# Patient Record
Sex: Female | Born: 1950
Health system: Southern US, Community
[De-identification: ages and names within clinical notes are randomized; demographics above are authoritative.]

## PROBLEM LIST (undated history)

## (undated) DIAGNOSIS — I639 Cerebral infarction, unspecified: Secondary | ICD-10-CM

## (undated) HISTORY — DX: Cerebral infarction, unspecified: I63.9

## (undated) HISTORY — PX: OTHER SURGICAL HISTORY: SHX169

---

## 2018-08-27 ENCOUNTER — Other Ambulatory Visit: Payer: Self-pay

## 2018-08-27 ENCOUNTER — Emergency Department (HOSPITAL_BASED_OUTPATIENT_CLINIC_OR_DEPARTMENT_OTHER): Payer: Medicare Other

## 2018-08-27 ENCOUNTER — Observation Stay (HOSPITAL_BASED_OUTPATIENT_CLINIC_OR_DEPARTMENT_OTHER)
Admission: EM | Admit: 2018-08-27 | Discharge: 2018-08-28 | Disposition: A | Discharge status: Home / Self Care | Payer: Medicare Other | Attending: Internal Medicine | Admitting: Internal Medicine

## 2018-08-27 ENCOUNTER — Inpatient Hospital Stay (HOSPITAL_COMMUNITY): Payer: Medicare Other

## 2018-08-27 ENCOUNTER — Encounter (HOSPITAL_BASED_OUTPATIENT_CLINIC_OR_DEPARTMENT_OTHER): Payer: Self-pay | Admitting: Emergency Medicine

## 2018-08-27 DIAGNOSIS — R739 Hyperglycemia, unspecified: Secondary | ICD-10-CM

## 2018-08-27 DIAGNOSIS — I639 Cerebral infarction, unspecified: Principal | ICD-10-CM | POA: Insufficient documentation

## 2018-08-27 DIAGNOSIS — Z8673 Personal history of transient ischemic attack (TIA), and cerebral infarction without residual deficits: Secondary | ICD-10-CM | POA: Diagnosis present

## 2018-08-27 DIAGNOSIS — R4781 Slurred speech: Secondary | ICD-10-CM | POA: Diagnosis not present

## 2018-08-27 DIAGNOSIS — Z79899 Other long term (current) drug therapy: Secondary | ICD-10-CM | POA: Insufficient documentation

## 2018-08-27 DIAGNOSIS — G458 Other transient cerebral ischemic attacks and related syndromes: Secondary | ICD-10-CM | POA: Diagnosis not present

## 2018-08-27 DIAGNOSIS — R29818 Other symptoms and signs involving the nervous system: Secondary | ICD-10-CM | POA: Diagnosis not present

## 2018-08-27 DIAGNOSIS — R531 Weakness: Secondary | ICD-10-CM | POA: Diagnosis not present

## 2018-08-27 DIAGNOSIS — R911 Solitary pulmonary nodule: Secondary | ICD-10-CM | POA: Insufficient documentation

## 2018-08-27 DIAGNOSIS — R030 Elevated blood-pressure reading, without diagnosis of hypertension: Secondary | ICD-10-CM | POA: Insufficient documentation

## 2018-08-27 DIAGNOSIS — E785 Hyperlipidemia, unspecified: Secondary | ICD-10-CM | POA: Diagnosis not present

## 2018-08-27 LAB — RAPID URINE DRUG SCREEN, HOSP PERFORMED
Amphetamines: NOT DETECTED
Barbiturates: NOT DETECTED
Benzodiazepines: NOT DETECTED
COCAINE: NOT DETECTED
OPIATES: NOT DETECTED
TETRAHYDROCANNABINOL: NOT DETECTED

## 2018-08-27 LAB — COMPREHENSIVE METABOLIC PANEL
ALBUMIN: 3.9 g/dL (ref 3.5–5.0)
ALT: 23 U/L (ref 0–44)
ANION GAP: 11 (ref 5–15)
AST: 29 U/L (ref 15–41)
Alkaline Phosphatase: 86 U/L (ref 38–126)
BUN: 22 mg/dL (ref 8–23)
CALCIUM: 9.2 mg/dL (ref 8.9–10.3)
CO2: 26 mmol/L (ref 22–32)
Chloride: 102 mmol/L (ref 98–111)
Creatinine, Ser: 0.82 mg/dL (ref 0.44–1.00)
GFR calc Af Amer: >60 mL/min (ref >60)
GFR calc non Af Amer: >60 mL/min (ref >60)
GLUCOSE: 122 mg/dL — AB (ref 70–99)
POTASSIUM: 4.1 mmol/L (ref 3.5–5.1)
SODIUM: 139 mmol/L (ref 135–145)
TOTAL PROTEIN: 7.2 g/dL (ref 6.5–8.1)
Total Bilirubin: 0.5 mg/dL (ref 0.3–1.2)

## 2018-08-27 LAB — URINALYSIS, ROUTINE W REFLEX MICROSCOPIC
Bilirubin Urine: NEGATIVE
Glucose, UA: NEGATIVE mg/dL
Hgb urine dipstick: NEGATIVE
Ketones, ur: NEGATIVE mg/dL
LEUKOCYTES UA: NEGATIVE
Nitrite: NEGATIVE
PH: 7 (ref 5.0–8.0)
Protein, ur: NEGATIVE mg/dL

## 2018-08-27 LAB — CBC
HCT: 42.9 % (ref 36.0–46.0)
Hemoglobin: 14.6 g/dL (ref 12.0–15.0)
MCH: 30.5 pg (ref 26.0–34.0)
MCHC: 34 g/dL (ref 30.0–36.0)
MCV: 89.7 fL (ref 78.0–100.0)
PLATELETS: 342 10*3/uL (ref 150–400)
RBC: 4.78 MIL/uL (ref 3.87–5.11)
RDW: 12.5 % (ref 11.5–15.5)
WBC: 6.1 10*3/uL (ref 4.0–10.5)

## 2018-08-27 LAB — TSH: TSH: 1.495 u[IU]/mL (ref 0.350–4.500)

## 2018-08-27 LAB — PROTIME-INR
INR: 0.8
PROTHROMBIN TIME: 11 s — AB (ref 11.4–15.2)

## 2018-08-27 LAB — DIFFERENTIAL
Basophils Absolute: 0 10*3/uL (ref 0.0–0.1)
Basophils Relative: 1 %
EOS ABS: 0.2 10*3/uL (ref 0.0–0.7)
EOS PCT: 3 %
LYMPHS PCT: 37 %
Lymphs Abs: 2.3 10*3/uL (ref 0.7–4.0)
Monocytes Absolute: 0.7 10*3/uL (ref 0.1–1.0)
Monocytes Relative: 11 %
NEUTROS PCT: 48 %
Neutro Abs: 3 10*3/uL (ref 1.7–7.7)

## 2018-08-27 LAB — APTT: APTT: 33 s (ref 24–36)

## 2018-08-27 LAB — TROPONIN I

## 2018-08-27 LAB — CBG MONITORING, ED: Glucose-Capillary: 117 mg/dL — ABNORMAL HIGH (ref 70–99)

## 2018-08-27 LAB — ECHOCARDIOGRAM COMPLETE
Height: 65 in
Weight: 2240 oz

## 2018-08-27 LAB — ETHANOL: Alcohol, Ethyl (B): <10 mg/dL (ref <10)

## 2018-08-27 MED ORDER — CLOPIDOGREL BISULFATE 75 MG PO TABS
300.0000 mg | ORAL_TABLET | Freq: Once | ORAL | Status: AC
  Administered 2018-08-27: 300 mg via ORAL
  Filled 2018-08-27: qty 4

## 2018-08-27 MED ORDER — ASPIRIN 300 MG RE SUPP: 300.0000 mg | Freq: Every day | RECTAL | Status: DC

## 2018-08-27 MED ORDER — ATORVASTATIN CALCIUM 40 MG PO TABS
40.0000 mg | ORAL_TABLET | Freq: Every day | ORAL | Status: DC
  Administered 2018-08-27: 40 mg via ORAL
  Filled 2018-08-27: qty 1

## 2018-08-27 MED ORDER — IOPAMIDOL (ISOVUE-370) INJECTION 76%
100.0000 mL | Freq: Once | INTRAVENOUS | Status: AC | PRN
  Administered 2018-08-27: 100 mL via INTRAVENOUS

## 2018-08-27 MED ORDER — ENOXAPARIN SODIUM 40 MG/0.4ML ~~LOC~~ SOLN
40.0000 mg | SUBCUTANEOUS | Status: DC
  Administered 2018-08-27 – 2018-08-28 (×2): 40 mg via SUBCUTANEOUS
  Filled 2018-08-27 (×2): qty 0.4

## 2018-08-27 MED ORDER — ACETAMINOPHEN 650 MG RE SUPP: 650.0000 mg | RECTAL | Status: DC | PRN

## 2018-08-27 MED ORDER — ASPIRIN 325 MG PO TABS
325.0000 mg | ORAL_TABLET | Freq: Every day | ORAL | Status: DC
  Administered 2018-08-27 – 2018-08-28 (×2): 325 mg via ORAL
  Filled 2018-08-27 (×2): qty 1

## 2018-08-27 MED ORDER — ACETAMINOPHEN 160 MG/5ML PO SOLN: 650.0000 mg | ORAL | Status: DC | PRN

## 2018-08-27 MED ORDER — ACETAMINOPHEN 325 MG PO TABS: 650.0000 mg | ORAL_TABLET | ORAL | Status: DC | PRN

## 2018-08-27 MED ORDER — STROKE: EARLY STAGES OF RECOVERY BOOK
Freq: Once | Status: AC
  Administered 2018-08-27: 17:00:00

## 2018-08-27 MED ORDER — SENNOSIDES-DOCUSATE SODIUM 8.6-50 MG PO TABS: 1.0000 | ORAL_TABLET | Freq: Every evening | ORAL | Status: DC | PRN

## 2018-08-27 MED ORDER — CLOPIDOGREL BISULFATE 75 MG PO TABS
75.0000 mg | ORAL_TABLET | Freq: Every day | ORAL | Status: DC
  Administered 2018-08-28: 75 mg via ORAL
  Filled 2018-08-27 (×2): qty 1

## 2018-08-27 MED ORDER — SODIUM CHLORIDE 0.9 % IV SOLN
INTRAVENOUS | Status: DC
  Administered 2018-08-27: 17:00:00 via INTRAVENOUS

## 2018-08-27 NOTE — H&P (Signed)
History and Physical    Melinda Burns ZOX:[id number] DOB: September 23, 1951 DOA: 08/27/2018  PCP:  Oneta Rack (has never actually ever him) - no doctor in 23 years other than an oral surgeon, who "is a doctor, he checked a blood pressure" Consultants:  None Patient coming from:  Home - lives with husband; NOK: Husband, [phone]  Chief Complaint: Weakness  HPI: Melinda Burns is a 67 y.o. female with no significant past medical history presenting to [facility] with left-sided weakness and dysarthria.   She developed a cramp in the back of her left leg about 430 and was having trouble walking.  He subsided for a while and then she started feeling on her left side again.  Her husband noticed a left facial droop.  They went to [facility] for evaluation.  No dysphagia.  Mild dysarthria.  Her thought processes were fine - she was able to criticize her husband's driving on the way in.  Word selection was also fine.  The left leg > arm were involved and was moving slowly.  Her left face still looks droopy.  Her pulse was high and her BP was very elevated.  She does not know if her BP has ever been elevated before.   ED Course:  Negative CT and CTA (other than incidental RUL nodule).    Review of Systems: As per HPI; otherwise review of systems reviewed and negative.   Ambulatory Status:  Ambulates without assistance  History reviewed. No pertinent past medical history.  Past Surgical History:  Procedure Laterality Date  . cataracts      Social History   Socioeconomic History  . Marital status: Married    Spouse name: Not on file  . Number of children: Not on file  . Years of education: Not on file  . Highest education level: Not on file  Occupational History  . Occupation: Audiological scientist  Social Needs  . Financial resource strain: Not on file  . Food insecurity:    Worry: Not on file    Inability: Not on file  . Transportation needs:    Medical: Not on file    Non-medical: Not on file  Tobacco  Use  . Smoking status: Never Smoker  . Smokeless tobacco: Never Used  Substance and Sexual Activity  . Alcohol use: Yes    Comment: occasional wine  . Drug use: Not on file  . Sexual activity: Not on file  Lifestyle  . Physical activity:    Days per week: Not on file    Minutes per session: Not on file  . Stress: Not on file  Relationships  . Social connections:    Talks on phone: Not on file    Gets together: Not on file    Attends religious service: Not on file    Active member of club or organization: Not on file    Attends meetings of clubs or organizations: Not on file    Relationship status: Not on file  . Intimate partner violence:    Fear of current or ex partner: Not on file    Emotionally abused: Not on file    Physically abused: Not on file    Forced sexual activity: Not on file  Other Topics Concern  . Not on file  Social History Narrative  . Not on file    Allergies  Allergen Reactions  . Codeine     Family History  Problem Relation Age of Onset  . Transient ischemic attack Mother   .  COPD Mother     Prior to Admission medications   Not on File    Physical Exam: Vitals:   08/27/18 1007 08/27/18 1030 08/27/18 1109 08/27/18 1216  BP: (!) 147/73 139/67 (!) 150/95 (!) 145/85  Pulse: 69 71 74 72  Resp: 16 20 16 16   Temp: 98.4 F (36.9 C)   98.2 F (36.8 C)  TempSrc: Oral   Oral  SpO2: 100% 100% 99% 100%  Weight:      Height:         General:  Appears calm and comfortable and is NAD with left facial droop visible at rest Eyes:   EOMI, normal lids, iris ENT:  grossly normal hearing, lips & tongue, mmm Neck:  no LAD, masses or thyromegaly; no carotid bruits Cardiovascular:  RRR, no m/r/g. 1+ pitting LE edema.  Respiratory:   CTA bilaterally with no wheezes/rales/rhonchi.  Normal respiratory effort. Abdomen:  soft, NT, ND, NABS Skin:  no rash or induration seen on limited exam Musculoskeletal:  grossly normal tone BUE/BLE, good ROM, no bony  abnormality Psychiatric:  grossly normal mood and affect, speech fluent and appropriate, AOx3 Neurologic: CN 2-12 grossly intact other than left facial droop, moves all extremities in coordinated fashion, sensation intact    Radiological Exams on Admission: Ct Angio Head W Or Wo Contrast  Result Date: 08/27/2018 CLINICAL DATA:  Left-sided weakness.  Slurred speech. EXAM: CT ANGIOGRAPHY HEAD AND NECK TECHNIQUE: Multidetector CT imaging of the head and neck was performed using the standard protocol during bolus administration of intravenous contrast. Multiplanar CT image reconstructions and MIPs were obtained to evaluate the vascular anatomy. Carotid stenosis measurements (when applicable) are obtained utilizing NASCET criteria, using the distal internal carotid diameter as the denominator. CONTRAST:  ISOVUE-370 IOPAMIDOL (ISOVUE-370) INJECTION 76% COMPARISON:  CT head 08/27/2018 FINDINGS: CTA NECK FINDINGS Aortic arch: Standard branching. Imaged portion shows no evidence of aneurysm or dissection. No significant stenosis of the major arch vessel origins. Right carotid system: Right common carotid artery widely patent. Mild atherosclerotic plaque right carotid bifurcation without stenosis. Beaded irregularity of the right internal carotid artery at the C2 level compatible with fibromuscular dysplasia. Negative for dissection or stenosis. Left carotid system: Normal left carotid without stenosis or fibromuscular dysplasia. Vertebral arteries: Both vertebral arteries widely patent without stenosis Skeleton: Mild degenerative changes in the cervical spine. No acute skeletal abnormality. Other neck: Negative Upper chest: 4 mm nodule right upper lobe. Remaining upper lobes are clear. No effusion. Review of the MIP images confirms the above findings CTA HEAD FINDINGS Anterior circulation: Cavernous carotid normal bilaterally without stenosis. Anterior and middle cerebral arteries normal bilaterally without  stenosis or occlusion. Posterior circulation: Both vertebral arteries patent to the basilar. PICA patent bilaterally. AICA patent. Superior cerebellar and posterior cerebral arteries patent bilaterally. Basilar widely patent. Fetal origin of the right posterior cerebral artery. Venous sinuses: Patent Anatomic variants: None Delayed phase: Normal enhancement on delayed imaging. Mild chronic microvascular ischemic changes. Review of the MIP images confirms the above findings IMPRESSION: 1. No significant carotid or vertebral artery stenosis in the neck. Mild atherosclerotic disease right carotid bifurcation. Fibromuscular dysplasia right internal carotid artery without stenosis or dissection 2. Negative for emergent large vessel occlusion. No significant intracranial stenosis. 3. 4 mm right upper lobe nodule No follow-up needed if patient is low-risk. Non-contrast chest CT can be considered in 12 months if patient is high-risk. This recommendation follows the consensus statement: Guidelines for Management of Incidental Pulmonary Nodules Detected on CT  Images: From the Fleischner Society 2017; Radiology 2017; [phone]. Electronically Signed   By: Marlan Palau M.D.   On: 08/27/2018 08:26   Ct Angio Neck W And/or Wo Contrast  Result Date: 08/27/2018 CLINICAL DATA:  Left-sided weakness.  Slurred speech. EXAM: CT ANGIOGRAPHY HEAD AND NECK TECHNIQUE: Multidetector CT imaging of the head and neck was performed using the standard protocol during bolus administration of intravenous contrast. Multiplanar CT image reconstructions and MIPs were obtained to evaluate the vascular anatomy. Carotid stenosis measurements (when applicable) are obtained utilizing NASCET criteria, using the distal internal carotid diameter as the denominator. CONTRAST:  ISOVUE-370 IOPAMIDOL (ISOVUE-370) INJECTION 76% COMPARISON:  CT head 08/27/2018 FINDINGS: CTA NECK FINDINGS Aortic arch: Standard branching. Imaged portion shows no evidence  of aneurysm or dissection. No significant stenosis of the major arch vessel origins. Right carotid system: Right common carotid artery widely patent. Mild atherosclerotic plaque right carotid bifurcation without stenosis. Beaded irregularity of the right internal carotid artery at the C2 level compatible with fibromuscular dysplasia. Negative for dissection or stenosis. Left carotid system: Normal left carotid without stenosis or fibromuscular dysplasia. Vertebral arteries: Both vertebral arteries widely patent without stenosis Skeleton: Mild degenerative changes in the cervical spine. No acute skeletal abnormality. Other neck: Negative Upper chest: 4 mm nodule right upper lobe. Remaining upper lobes are clear. No effusion. Review of the MIP images confirms the above findings CTA HEAD FINDINGS Anterior circulation: Cavernous carotid normal bilaterally without stenosis. Anterior and middle cerebral arteries normal bilaterally without stenosis or occlusion. Posterior circulation: Both vertebral arteries patent to the basilar. PICA patent bilaterally. AICA patent. Superior cerebellar and posterior cerebral arteries patent bilaterally. Basilar widely patent. Fetal origin of the right posterior cerebral artery. Venous sinuses: Patent Anatomic variants: None Delayed phase: Normal enhancement on delayed imaging. Mild chronic microvascular ischemic changes. Review of the MIP images confirms the above findings IMPRESSION: 1. No significant carotid or vertebral artery stenosis in the neck. Mild atherosclerotic disease right carotid bifurcation. Fibromuscular dysplasia right internal carotid artery without stenosis or dissection 2. Negative for emergent large vessel occlusion. No significant intracranial stenosis. 3. 4 mm right upper lobe nodule No follow-up needed if patient is low-risk. Non-contrast chest CT can be considered in 12 months if patient is high-risk. This recommendation follows the consensus statement:  Guidelines for Management of Incidental Pulmonary Nodules Detected on CT Images: From the Fleischner Society 2017; Radiology 2017; [id number]. Electronically Signed   By: Marlan Palau M.D.   On: 08/27/2018 08:26   Ct Head Code Stroke Wo Contrast  Result Date: 08/27/2018 CLINICAL DATA:  Code stroke. Slurred speech. Episode of left-sided weakness. EXAM: CT HEAD WITHOUT CONTRAST TECHNIQUE: Contiguous axial images were obtained from the base of the skull through the vertex without intravenous contrast. COMPARISON:  None. FINDINGS: Brain: No evidence of acute infarction, hemorrhage, hydrocephalus, extra-axial collection or mass lesion/mass effect. Mild chronic microvascular ischemia in the frontal white matter bilaterally. Small chronic infarct anterior limb internal capsule on the left. Vascular: Negative for hyperdense vessel Skull: Negative Sinuses/Orbits: Paranasal sinuses clear.  Bilateral cataract surgery Other: None ASPECTS (Alberta Stroke Program Early CT Score) - Ganglionic level infarction (caudate, lentiform nuclei, internal capsule, insula, M1-M3 cortex): 7 - Supraganglionic infarction (M4-M6 cortex): 3 Total score (0-10 with 10 being normal): 10 IMPRESSION: 1. No acute abnormality. 2. Mild chronic ischemic change in the white matter and internal capsule on the left. 3. ASPECTS is 10 4. These results were called by telephone at the time of interpretation on  08/27/2018 at 7:00 am to Dr. Madilyn Hook , who verbally acknowledged these results. Electronically Signed   By: Marlan Palau M.D.   On: 08/27/2018 07:00    EKG: Independently reviewed.  Sinus tachycardia with rate 100; nonspecific ST changes with no evidence of acute ischemia   Labs on Admission: I have personally reviewed the available labs and imaging studies at the time of the admission.  Pertinent labs:   Glucose 122, CMP otherwise WNL Troponin <0.03 CBC WNL INR 0.8 UA essentially normal ETOH negative  Assessment/Plan Active  Problems:   Acute CVA (cerebrovascular accident) (HCC)   Elevated BP without diagnosis of hypertension   Hyperglycemia   TIA vs. Acute CTA -Patient presented this AM to [facility] with acute CVA symptoms -Her symptoms have primarily resolved, although she continues to have a left facial droop -CT/CTA negative, but suspicion is still fairly high for CVA; if negative MRI, then TIA -Will admit for further CVA/TIA evaluation -Telemetry monitoring -MRI/MRA -Echo -Risk stratification with FLP, A1c; will also check TSH and UDS -ASA daily -Neurology consult -PT/OT/ST/Nutrition Consults -Will start empiric Lipitor 40 mg daily for now  Elevated BP -Allow permissive HTN for now -Treat BP only if >220/120, and then with goal of 15% reduction -She has not seen a physician in >20 years and so it is not unlikely that she has undiagnosed HTN   Hyperglycemia -Check A1c and follow fasting glucose on AM labs    DVT prophylaxis:  Lovenox  Code Status: DNR - confirmed with patient/family Family Communication: Husband present throughout evaluation Disposition Plan:  Home once clinically improved Consults called: Neurology; PT/OT/ST/Nutrition  Admission status: Admit - It is my clinical opinion that admission to INPATIENT is reasonable and necessary because of the expectation that this patient will require hospital care that crosses at least 2 midnights to treat this condition based on the medical complexity of the problems presented.  Given the aforementioned information, the predictability of an adverse outcome is felt to be significant.    Jonah Blue MD [facility]  If note is complete, please contact covering daytime or nighttime physician. [email or web address] Password [email or web address]  08/27/2018, 1:25 PM

## 2018-08-27 NOTE — ED Notes (Signed)
Patient transported to CT 

## 2018-08-27 NOTE — ED Notes (Signed)
ED Provider at bedside. 

## 2018-08-27 NOTE — ED Triage Notes (Signed)
Patient states she woke up at 0430 am with left leg cramping and left leg and left arm weakness; states had difficulty walking; per spouse states slurred speech

## 2018-08-27 NOTE — Consult Note (Addendum)
Neurology Consultation  Reason for Consult: TIA/CVA Referring Physician: OGBATA,   History is obtained from: Husband and patient  HPI: Melinda Burns is a 67 y.o. female with no significant pertinent history as patient has not seen a PCP in the last 17 years.  Patient awoke at approximately 430 this morning and noticed that she had decreased sensation in her left face, arm, leg she also felt weak in these areas.  She talked her husband who noted that she was having dysarthria and slight left facial droop.  Patient did not stay up all night one back to sleep but upon waking noted when she tried to get out of bed she had difficulty walking secondary to left leg weakness.  Has been continued to see the facial droop and some dysarthria which has resolved at this time.  For that reason elevated blood pressure looking at the chart her blood pressure was approximately 177/97.  Patient has been admitted at this time she feels as though she is resolving.  Especially with the leg strength.  She still sounds dysarthric however her husband seems to think this is her baseline.  She does not take aspirin or any other medications as noted she has not seen a PCP in quite a while.   LKW: 2:30(patient states 3, but unsure of time) on 08/27/2018 tpa given?: no, out of window with resolving symptoms Premorbid modified Rankin scale (mRS): 0 High stroke score is 1  ROS: A 14 point ROS was performed and is negative except as noted in the HPI.   History reviewed. No pertinent past medical history. None that she knows of as she has not seen a PCP in a long time   Family History  Problem Relation Age of Onset  . Transient ischemic attack Mother   . COPD Mother    Social History:   reports that she has never smoked. She has never used smokeless tobacco. She reports that she drinks alcohol. Her drug history is not on file.  Medications  Current Facility-Administered Medications:  .   stroke: mapping our early  stages of recovery book, , Does not apply, Once, Jonah Blue, MD .  0.9 %  sodium chloride infusion, , Intravenous, Continuous, Jonah Blue, MD .  acetaminophen (TYLENOL) tablet 650 mg, 650 mg, Oral, Q4H PRN **OR** acetaminophen (TYLENOL) solution 650 mg, 650 mg, Per Tube, Q4H PRN **OR** acetaminophen (TYLENOL) suppository 650 mg, 650 mg, Rectal, Q4H PRN, Jonah Blue, MD .  aspirin suppository 300 mg, 300 mg, Rectal, Daily **OR** aspirin tablet 325 mg, 325 mg, Oral, Daily, Jonah Blue, MD .  atorvastatin (LIPITOR) tablet 40 mg, 40 mg, Oral, q1800, Jonah Blue, MD .  enoxaparin (LOVENOX) injection 40 mg, 40 mg, Subcutaneous, Q24H, Jonah Blue, MD .  senna-docusate (Senokot-S) tablet 1 tablet, 1 tablet, Oral, QHS PRN, Jonah Blue, MD   Exam: Current vital signs: BP (!) 145/85 (BP Location: Left Arm)   Pulse 72   Temp 98.2 F (36.8 C) (Oral)   Resp 16   Ht 5\' 5"  (1.651 m)   Wt 63.5 kg   SpO2 100%   BMI 23.30 kg/m  Vital signs in last 24 hours: Temp:  [98.1 F (36.7 C)-98.4 F (36.9 C)] 98.2 F (36.8 C) (09/05 1216) Pulse Rate:  [69-84] 72 (09/05 1216) Resp:  [16-22] 16 (09/05 1216) BP: (136-177)/(67-101) 145/85 (09/05 1216) SpO2:  [99 %-100 %] 100 % (09/05 1216) Weight:  [63.5 kg] 63.5 kg (09/05 0631)  GENERAL: Awake, alert in  NAD HEENT: - Normocephalic and atraumatic, dry mm, no LN++, no Thyromegally Ext: warm, well perfused, intact peripheral pulses, __ edema  NEURO:  Mental Status: AA&Ox3, speech is dysarthric to me however husband states that it sounds normal to him.  Naming, repetition, fluency, and comprehension intact. Cranial Nerves: PERRL 2 mm/brisk. EOMI, visual fields full, left corner of her mouth has a facial droop along with decreased nasolabial fold, increased facial sensation on the left, hearing intact, tongue/uvula/soft palate midline, Motor: 5/5 throughout with the exception of her left tricep extension which is 4/5 Tone: is normal  and bulk is normal Sensation- Intact to light touch bilaterally Coordination: FTN intact bilaterally, no ataxia in BLE. Gait- deferred    Labs I have reviewed labs in epic and the results pertinent to this consultation are:   CBC    Component Value Date/Time   WBC 6.1 08/27/2018 0636   RBC 4.78 08/27/2018 0636   HGB 14.6 08/27/2018 0636   HCT 42.9 08/27/2018 0636   PLT 342 08/27/2018 0636   MCV 89.7 08/27/2018 0636   MCH 30.5 08/27/2018 0636   MCHC 34.0 08/27/2018 0636   RDW 12.5 08/27/2018 0636   LYMPHSABS 2.3 08/27/2018 0636   MONOABS 0.7 08/27/2018 0636   EOSABS 0.2 08/27/2018 0636   BASOSABS 0.0 08/27/2018 0636    CMP     Component Value Date/Time   NA 139 08/27/2018 0636   K 4.1 08/27/2018 0636   CL 102 08/27/2018 0636   CO2 26 08/27/2018 0636   GLUCOSE 122 (H) 08/27/2018 0636   BUN 22 08/27/2018 0636   CREATININE 0.82 08/27/2018 0636   CALCIUM 9.2 08/27/2018 0636   PROT 7.2 08/27/2018 0636   ALBUMIN 3.9 08/27/2018 0636   AST 29 08/27/2018 0636   ALT 23 08/27/2018 0636   ALKPHOS 86 08/27/2018 0636   BILITOT 0.5 08/27/2018 0636   GFRNONAA >60 08/27/2018 0636   GFRAA >60 08/27/2018 0636    Lipid Panel  No results found for: CHOL, TRIG, HDL, CHOLHDL, VLDL, LDLCALC, LDLDIRECT   Imaging I have reviewed the images obtained:*  CT-scan of the brain--no acute abnormalities.  CTA of head neck- no significant carotid or vertebral artery stenosis in the neck, mild atherosclerotic disease in the right carotid bifurcation.  Fibromuscular dysplasia right internal carotid artery without stenosis or dissection.  Negative for emergent large vessel occlusion.  MRI examination of the brain--pending  I have seen and evaluated the patient. I have reviewed the above note and made appropriate changes.   Assessment: 67 yo F with symptoms consistent with a mild stroke. She was not a tpa candidate due to improving symptoms and being unclear if she was truly in the window  due to uncertainty of time. She is being admitted for risk factor modification.   Recommendations: Recommend # MRI of the brain without contrast #Transthoracic Echo,   # Start patient on ASA 325mg  daily, plavix x 3 weeks #Start or continue Atorvastatin 80 mg/other high intensity statin # BP goal: permissive HTN upto 220/120 mmHg # HBAIC and Lipid profile # Telemetry monitoring # Frequent neuro checks # NPO until passes stroke swallow screen # please page stroke NP  Or  PA  Or MD from 8am -4 pm  as this patient from this time will be  followed by the stroke.   You can look them up on [email or web address]  Password [email or web address]  Ritta Slot, MD [facility] [phone]  If 7pm- 7am, please page neurology on call as listed  in AMION.

## 2018-08-27 NOTE — ED Provider Notes (Signed)
[facility] EMERGENCY DEPARTMENT Provider Note   CSN: [id number] Arrival date & time: 08/27/18  7829     History   Chief Complaint Chief Complaint  Patient presents with  . Weakness    HPI Melinda Burns is a 67 y.o. female.  The history is provided by the patient and the spouse. No language interpreter was used.  Weakness    Melinda Burns is a 67 y.o. female who presents to the Emergency Department complaining of weakness. She awoke this morning at 430 and noticed she had a cramp in her left calf and weakness in the left arm and left leg.She woke to use the bathroom around three and was able to get up without difficulty. She went to bed around 930 and was asymptomatic at that time. She has noted difficulty with walking up and down the stairs as well as with her exercise routine. She is having difficulty holding her coffee and feels weak in her left hand. Her husband notes that her speech is slightly different from baseline. She has no known medical problems and takes no medications. No prior similar symptoms.  No recent illness.   History reviewed. No pertinent past medical history.  There are no active problems to display for this patient.   History reviewed. No pertinent surgical history.   OB History   None      Home Medications    Prior to Admission medications   Not on File    Family History No family history on file.  Social History Social History   Tobacco Use  . Smoking status: Never Smoker  . Smokeless tobacco: Never Used  Substance Use Topics  . Alcohol use: Yes  . Drug use: Not on file     Allergies   Codeine   Review of Systems Review of Systems  Neurological: Positive for weakness.  All other systems reviewed and are negative.    Physical Exam Updated Vital Signs BP (!) 177/97   Pulse 84   Temp 98.3 F (36.8 C) (Oral)   Resp (!) 22   Ht 5\' 5"  (1.651 m)   Wt 63.5 kg   SpO2 100%   BMI 23.30 kg/m   Physical  Exam  Constitutional: She is oriented to person, place, and time. She appears well-developed and well-nourished.  HENT:  Head: Normocephalic and atraumatic.  Cardiovascular: Normal rate and regular rhythm.  No murmur heard. Pulmonary/Chest: Effort normal and breath sounds normal. No respiratory distress.  Abdominal: Soft. There is no tenderness. There is no rebound and no guarding.  Musculoskeletal: She exhibits no tenderness.  Trace nonpitting edema to BLE  Neurological: She is alert and oriented to person, place, and time.  Weakness of left lower face with mild dysarthria. No aphasia.  Visual fields are grossly intact. No ataxia on finger to nose bilaterally. Five out of five strength in all four extremities with sensation to light touch intact in all four extremities. No pronator drift.  Skin: Skin is warm and dry.  Psychiatric: She has a normal mood and affect. Her behavior is normal.  Nursing note and vitals reviewed.    ED Treatments / Results  Labs (all labs ordered are listed, but only abnormal results are displayed) Labs Reviewed  PROTIME-INR - Abnormal; Notable for the following components:      Result Value   Prothrombin Time 11.0 (*)    All other components within normal limits  COMPREHENSIVE METABOLIC PANEL - Abnormal; Notable for the following components:  Glucose, Bld 122 (*)    All other components within normal limits  CBG MONITORING, ED - Abnormal; Notable for the following components:   Glucose-Capillary 117 (*)    All other components within normal limits  ETHANOL  APTT  CBC  DIFFERENTIAL  TROPONIN I  RAPID URINE DRUG SCREEN, HOSP PERFORMED  URINALYSIS, ROUTINE W REFLEX MICROSCOPIC    EKG EKG Interpretation  Date/Time:  Thursday August 27 2018 06:30:32 EDT Ventricular Rate:  100 PR Interval:    QRS Duration: 97 QT Interval:  368 QTC Calculation: 475 R Axis:   89 Text Interpretation:  Sinus tachycardia Borderline right axis deviation  Borderline repolarization abnormality Baseline wander in lead(s) V2 Confirmed by Nicanor Alcon, April ([id number]) on 08/27/2018 6:33:45 AM   Radiology Ct Head Code Stroke Wo Contrast  Result Date: 08/27/2018 CLINICAL DATA:  Code stroke. Slurred speech. Episode of left-sided weakness. EXAM: CT HEAD WITHOUT CONTRAST TECHNIQUE: Contiguous axial images were obtained from the base of the skull through the vertex without intravenous contrast. COMPARISON:  None. FINDINGS: Brain: No evidence of acute infarction, hemorrhage, hydrocephalus, extra-axial collection or mass lesion/mass effect. Mild chronic microvascular ischemia in the frontal white matter bilaterally. Small chronic infarct anterior limb internal capsule on the left. Vascular: Negative for hyperdense vessel Skull: Negative Sinuses/Orbits: Paranasal sinuses clear.  Bilateral cataract surgery Other: None ASPECTS (Alberta Stroke Program Early CT Score) - Ganglionic level infarction (caudate, lentiform nuclei, internal capsule, insula, M1-M3 cortex): 7 - Supraganglionic infarction (M4-M6 cortex): 3 Total score (0-10 with 10 being normal): 10 IMPRESSION: 1. No acute abnormality. 2. Mild chronic ischemic change in the white matter and internal capsule on the left. 3. ASPECTS is 10 4. These results were called by telephone at the time of interpretation on 08/27/2018 at 7:00 am to Dr. Madilyn Hook , who verbally acknowledged these results. Electronically Signed   By: Marlan Palau M.D.   On: 08/27/2018 07:00    Procedures Procedures (including critical care time)  Medications Ordered in ED Medications - No data to display   Initial Impression / Assessment and Plan / ED Course  I have reviewed the triage vital signs and the nursing notes.  Pertinent labs & imaging results that were available during my care of the patient were reviewed by me and considered in my medical decision making (see chart for details).     Should here for evaluation of left sided weakness, speech  difficulties. She does have somewhat lower facial droop as well as dysarthria on examination. Her left upper and left lower extremity weakness have resolved on ED arrival. During her ED stay her symptoms continue to improve. Concern for TIA versus CVA. Medicine consulted for admission for further workup. Discussed the case with Dr. Amada Jupiter with neurology. Patient and family updated of findings of studies and recommendation for admission and they are in agreement with treatment plan.  Final Clinical Impressions(s) / ED Diagnoses   Final diagnoses:  None    ED Discharge Orders    None       Tilden Fossa, MD 08/27/18 [phone]

## 2018-08-27 NOTE — ED Provider Notes (Signed)
MSE was initiated and I personally evaluated the patient and placed orders (if any) at  6:49 AM on August 27, 2018.  The patient appears stable so that the remainder of the MSE may be completed by another provider.  Last seen normal at 930 pm 08/26/18.  Awoke with cramps in the LUE and LLE.  Then got up to work out and noticed BLE weak and some slurred speech, now resolved.  Also queasy on treadmill.     PE:  NCAT EOMI MMM RRR CTAB  NABS, soft non tender FROM x 4 5/5 strength  CT pending, labs pending     Melinda Suares, MD 08/27/18 [phone]

## 2018-08-27 NOTE — Progress Notes (Signed)
  Echocardiogram 2D Echocardiogram has been performed.  Leta Jungling M 08/27/2018, 3:18 PM

## 2018-08-27 NOTE — Consult Note (Signed)
Neurology Consultation  Reason for Consult: left side weakness   History is obtained from: Patient and Husband   HPI: Melinda Burns is a 67 y.o. female with no significant past medical history that presented to the ED on today (08/27/18) with c/o left side weakness. Patient reports that she woke up around 0430 this morning with notable left leg cramping, upon laying back down in bed she began to feel that the symptoms had resolved, shortly afterward she began to start her day as usual with her work; however, once she began to perform her usual in-home exercises, she noticed that her left leg was becoming weaker as she attempted to step up on her aerobics box. The left-side weakness was also accompanied by left-side facial drooping and slurred speech that was observed by her husband. The patient was last normal around 0330 this morning. Thus she was brought into the ED and Neurology was consulted for suspected stroke. Patient is now resting comfortably in bed, she denies any headache, blurred vision, weakness, dysphasia or pain.    LKW: 0330 today   ROS: A 14 point ROS was performed and is negative except as noted in the HPI.   History reviewed. No pertinent past medical history.   Family History  Problem Relation Age of Onset  . Transient ischemic attack Mother   . COPD Mother      Social History:   reports that she has never smoked. She has never used smokeless tobacco. She reports that she drinks alcohol. Her drug history is not on file.  Medications  Current Facility-Administered Medications:  .   stroke: mapping our early stages of recovery book, , Does not apply, Once, Jonah Blue, MD .  0.9 %  sodium chloride infusion, , Intravenous, Continuous, Jonah Blue, MD .  acetaminophen (TYLENOL) tablet 650 mg, 650 mg, Oral, Q4H PRN **OR** acetaminophen (TYLENOL) solution 650 mg, 650 mg, Per Tube, Q4H PRN **OR** acetaminophen (TYLENOL) suppository 650 mg, 650 mg, Rectal, Q4H  PRN, Jonah Blue, MD .  aspirin suppository 300 mg, 300 mg, Rectal, Daily **OR** aspirin tablet 325 mg, 325 mg, Oral, Daily, Jonah Blue, MD .  atorvastatin (LIPITOR) tablet 40 mg, 40 mg, Oral, q1800, Jonah Blue, MD .  enoxaparin (LOVENOX) injection 40 mg, 40 mg, Subcutaneous, Q24H, Jonah Blue, MD .  senna-docusate (Senokot-S) tablet 1 tablet, 1 tablet, Oral, QHS PRN, Jonah Blue, MD   Exam: Current vital signs: BP (!) 145/85 (BP Location: Left Arm)   Pulse 72   Temp 98.2 F (36.8 C) (Oral)   Resp 16   Ht 5\' 5"  (1.651 m)   Wt 63.5 kg   SpO2 100%   BMI 23.30 kg/m  Vital signs in last 24 hours: Temp:  [98.1 F (36.7 C)-98.4 F (36.9 C)] 98.2 F (36.8 C) (09/05 1216) Pulse Rate:  [69-84] 72 (09/05 1216) Resp:  [16-22] 16 (09/05 1216) BP: (136-177)/(67-101) 145/85 (09/05 1216) SpO2:  [99 %-100 %] 100 % (09/05 1216) Weight:  [63.5 kg] 63.5 kg (09/05 0631)  GENERAL: Awake, alert in NAD HEENT: - Normocephalic and atraumatic, dry mm, no Thyromegally Ext: warm, well perfused, no edema  NEURO:  Mental Status: AA&Ox3, speech is mildly dyarthric.  Naming, repetition, fluency, and comprehension intact. Cranial Nerves: PERRL, 71mm/brisk. EOMI, visual fields full, left side facial droop, facial sensation intact, hearing intact, tongue/uvula/soft palate midline, normal 5/5 sternocleidomastoid and trapezius muscle strength.  Motor: 5/5 strength in all extremities  Tone: is normal and bulk is normal Sensation- Intact  to light touch bilaterally Coordination: FTN intact bilaterally, no ataxia in BLE. Gait- steady   NIHSS: 2 LOC: 0 LOC Questions: 0 LOC Commands: 0 Best Gaze: 0 Visual: 0 Facial Palsy: 1  Motor Arm: 0 Motor Leg:0 Limb Ataxia: 0 Sensory: 0 Best Language: 0 Dysarthria: 1 Extinction and Inattention: 0     Labs I have reviewed labs in epic and the results pertinent to this consultation are:   CBC    Component Value Date/Time   WBC 6.1  08/27/2018 0636   RBC 4.78 08/27/2018 0636   HGB 14.6 08/27/2018 0636   HCT 42.9 08/27/2018 0636   PLT 342 08/27/2018 0636   MCV 89.7 08/27/2018 0636   MCH 30.5 08/27/2018 0636   MCHC 34.0 08/27/2018 0636   RDW 12.5 08/27/2018 0636   LYMPHSABS 2.3 08/27/2018 0636   MONOABS 0.7 08/27/2018 0636   EOSABS 0.2 08/27/2018 0636   BASOSABS 0.0 08/27/2018 0636    CMP     Component Value Date/Time   NA 139 08/27/2018 0636   K 4.1 08/27/2018 0636   CL 102 08/27/2018 0636   CO2 26 08/27/2018 0636   GLUCOSE 122 (H) 08/27/2018 0636   BUN 22 08/27/2018 0636   CREATININE 0.82 08/27/2018 0636   CALCIUM 9.2 08/27/2018 0636   PROT 7.2 08/27/2018 0636   ALBUMIN 3.9 08/27/2018 0636   AST 29 08/27/2018 0636   ALT 23 08/27/2018 0636   ALKPHOS 86 08/27/2018 0636   BILITOT 0.5 08/27/2018 0636   GFRNONAA >60 08/27/2018 0636   GFRAA >60 08/27/2018 0636    Lipid Panel  No results found for: CHOL, TRIG, HDL, CHOLHDL, VLDL, LDLCALC, LDLDIRECT   Imaging I have reviewed the images obtained:  CT ANGIO NECK W/WO CONTRAST: Impression: 1. No significant carotid or vertebral artery stenosis in the neck. Mild atherosclerotic disease right carotid bifurcation. Fibromuscular dysplasia right internal carotid artery without stenosis or dissection.  2. Negative for emergency large vessel occlusion. No significant intracranial stenosis. 3. 9mm right upper lobe nodule.  No follow-up needed if the patient is low-risk. Non-contrast chest CT can be considered in 12 months if patient is hight-risk.     Assessment: 67 y/o caucasian female with no significant past medial history presenting to the ED with left-side weakness and left-side facial drooping. Patient reports that the weakness has resolved; however, she continues to have left-side facial drooping accompanied by mild dysarthria. At this time, differential diagnosis would be suggestive of TIA versus Stroke with pending MR, MRA.     Recommendations: -MR,  MRA Head wo contrast -Echocardiogram -Carotid Doppler  -Aspirin 81mg  p.o. daily  -Plavix 75mg  p.o. daily  -PT consult and evaluation

## 2018-08-28 ENCOUNTER — Inpatient Hospital Stay (HOSPITAL_COMMUNITY): Payer: Medicare Other

## 2018-08-28 DIAGNOSIS — I639 Cerebral infarction, unspecified: Secondary | ICD-10-CM

## 2018-08-28 DIAGNOSIS — R739 Hyperglycemia, unspecified: Secondary | ICD-10-CM | POA: Diagnosis not present

## 2018-08-28 DIAGNOSIS — R030 Elevated blood-pressure reading, without diagnosis of hypertension: Secondary | ICD-10-CM | POA: Diagnosis not present

## 2018-08-28 DIAGNOSIS — Z8673 Personal history of transient ischemic attack (TIA), and cerebral infarction without residual deficits: Secondary | ICD-10-CM | POA: Diagnosis present

## 2018-08-28 LAB — HIV ANTIBODY (ROUTINE TESTING W REFLEX): HIV Screen 4th Generation wRfx: NONREACTIVE

## 2018-08-28 LAB — LIPID PANEL
CHOL/HDL RATIO: 4.7 ratio
Cholesterol: 256 mg/dL — ABNORMAL HIGH (ref 0–200)
HDL: 54 mg/dL (ref >40)
LDL Cholesterol: 166 mg/dL — ABNORMAL HIGH (ref 0–99)
Triglycerides: 179 mg/dL — ABNORMAL HIGH (ref <150)
VLDL: 36 mg/dL (ref 0–40)

## 2018-08-28 LAB — HEMOGLOBIN A1C
Hgb A1c MFr Bld: 5.3 % (ref 4.8–5.6)
Mean Plasma Glucose: 105.41 mg/dL

## 2018-08-28 MED ORDER — CLOPIDOGREL BISULFATE 75 MG PO TABS: 75.0000 mg | ORAL_TABLET | Freq: Every day | ORAL | Status: DC

## 2018-08-28 MED ORDER — ASPIRIN 81 MG PO TBEC: 81.0000 mg | DELAYED_RELEASE_TABLET | Freq: Every day | ORAL | 1 refills | Status: AC

## 2018-08-28 MED ORDER — ASPIRIN EC 81 MG PO TBEC: 81.0000 mg | DELAYED_RELEASE_TABLET | Freq: Every day | ORAL | Status: DC

## 2018-08-28 MED ORDER — ATORVASTATIN CALCIUM 80 MG PO TABS: 80.0000 mg | ORAL_TABLET | Freq: Every day | ORAL | Status: DC

## 2018-08-28 MED ORDER — CLOPIDOGREL BISULFATE 75 MG PO TABS: 75.0000 mg | ORAL_TABLET | Freq: Every day | ORAL | 0 refills | Status: AC

## 2018-08-28 MED ORDER — ATORVASTATIN CALCIUM 80 MG PO TABS: 80.0000 mg | ORAL_TABLET | Freq: Every day | ORAL | 1 refills | Status: DC

## 2018-08-28 NOTE — Progress Notes (Signed)
SLP Cancellation Note  Patient Details Name: Melinda Burns MRN: [id number] DOB: 05/16/1951   Cancelled treatment:       Reason Eval/Treat Not Completed: SLP screened, no needs identified, will sign off. Pt reports her speech is at baseline. 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 No needs identified at this time as pt declines assessment. Encouraged her to follow up with MD for further assessment in OP setting should she note functional deficits.  Rondel Baton, [place], CCC-SLP Speech-Language Pathologist Acute Rehabilitation Services Pager: [phone] Office: [phone]  Arlana Lindau 08/28/2018, 3:01 PM

## 2018-08-28 NOTE — Progress Notes (Addendum)
STROKE TEAM PROGRESS NOTE  HPI: ( Dr Amada Jupiter ) Melinda Burns is a 67 y.o. female with no significant pertinent history as patient has not seen a PCP in the last 17 years.  Patient awoke at approximately 430 this morning and noticed that she had decreased sensation in her left face, arm, leg she also felt weak in these areas.  She talked her husband who noted that she was having dysarthria and slight left facial droop.  Patient did not stay up all night one back to sleep but upon waking noted when she tried to get out of bed she had difficulty walking secondary to left leg weakness.  Has been continued to see the facial droop and some dysarthria which has resolved at this time.  For that reason elevated blood pressure looking at the chart her blood pressure was approximately 177/97.  Patient has been admitted at this time she feels as though she is resolving.  Especially with the leg strength.  She still sounds dysarthric however her husband seems to think this is her baseline.  She does not take aspirin or any other medications as noted she has not seen a PCP in quite a while.   LKW: 2:30(patient states 3, but unsure of time) on 08/27/2018 tpa given?: no, out of window with resolving symptoms Premorbid modified Rankin scale (mRS): 0 NIH stroke score is 1  INTERVAL HISTORY Her husband is at the bedside.  She recounted onset after waking yesterday while doing step aerobics. MRI shows a new stroke.   Vitals:   08/27/18 2016 08/28/18 0014 08/28/18 0434 08/28/18 0753  BP: (!) 144/84 140/70 127/76 (!) 151/86  Pulse: 76 63 68 70  Resp: 18 18 18 16   Temp: 98.3 F (36.8 C) 98.1 F (36.7 C) 98.4 F (36.9 C) 98.4 F (36.9 C)  TempSrc: Oral Oral Oral Oral  SpO2: 99% 98% 99% 100%  Weight:      Height:        CBC:  Recent Labs  Lab 08/27/18 0636  WBC 6.1  NEUTROABS 3.0  HGB 14.6  HCT 42.9  MCV 89.7  PLT 342    Basic Metabolic Panel:  Recent Labs  Lab 08/27/18 0636  NA 139  K  4.1  CL 102  CO2 26  GLUCOSE 122*  BUN 22  CREATININE 0.82  CALCIUM 9.2   Lipid Panel:     Component Value Date/Time   CHOL 256 (H) 08/28/2018 0609   TRIG 179 (H) 08/28/2018 0609   HDL 54 08/28/2018 0609   CHOLHDL 4.7 08/28/2018 0609   VLDL 36 08/28/2018 0609   LDLCALC 166 (H) 08/28/2018 0609   HgbA1c:  Lab Results  Component Value Date   HGBA1C 5.3 08/28/2018   Urine Drug Screen:     Component Value Date/Time   LABOPIA NONE DETECTED 08/27/2018 0730   COCAINSCRNUR NONE DETECTED 08/27/2018 0730   LABBENZ NONE DETECTED 08/27/2018 0730   AMPHETMU NONE DETECTED 08/27/2018 0730   THCU NONE DETECTED 08/27/2018 0730   LABBARB NONE DETECTED 08/27/2018 0730    Alcohol Level     Component Value Date/Time   ETH <10 08/27/2018 0636    IMAGING Ct Angio Head W Or Wo Contrast  Result Date: 08/27/2018 CLINICAL DATA:  Left-sided weakness.  Slurred speech. EXAM: CT ANGIOGRAPHY HEAD AND NECK TECHNIQUE: Multidetector CT imaging of the head and neck was performed using the standard protocol during bolus administration of intravenous contrast. Multiplanar CT image reconstructions and MIPs were obtained  to evaluate the vascular anatomy. Carotid stenosis measurements (when applicable) are obtained utilizing NASCET criteria, using the distal internal carotid diameter as the denominator. CONTRAST:  ISOVUE-370 IOPAMIDOL (ISOVUE-370) INJECTION 76% COMPARISON:  CT head 08/27/2018 FINDINGS: CTA NECK FINDINGS Aortic arch: Standard branching. Imaged portion shows no evidence of aneurysm or dissection. No significant stenosis of the major arch vessel origins. Right carotid system: Right common carotid artery widely patent. Mild atherosclerotic plaque right carotid bifurcation without stenosis. Beaded irregularity of the right internal carotid artery at the C2 level compatible with fibromuscular dysplasia. Negative for dissection or stenosis. Left carotid system: Normal left carotid without stenosis or  fibromuscular dysplasia. Vertebral arteries: Both vertebral arteries widely patent without stenosis Skeleton: Mild degenerative changes in the cervical spine. No acute skeletal abnormality. Other neck: Negative Upper chest: 4 mm nodule right upper lobe. Remaining upper lobes are clear. No effusion. Review of the MIP images confirms the above findings CTA HEAD FINDINGS Anterior circulation: Cavernous carotid normal bilaterally without stenosis. Anterior and middle cerebral arteries normal bilaterally without stenosis or occlusion. Posterior circulation: Both vertebral arteries patent to the basilar. PICA patent bilaterally. AICA patent. Superior cerebellar and posterior cerebral arteries patent bilaterally. Basilar widely patent. Fetal origin of the right posterior cerebral artery. Venous sinuses: Patent Anatomic variants: None Delayed phase: Normal enhancement on delayed imaging. Mild chronic microvascular ischemic changes. Review of the MIP images confirms the above findings IMPRESSION: 1. No significant carotid or vertebral artery stenosis in the neck. Mild atherosclerotic disease right carotid bifurcation. Fibromuscular dysplasia right internal carotid artery without stenosis or dissection 2. Negative for emergent large vessel occlusion. No significant intracranial stenosis. 3. 4 mm right upper lobe nodule No follow-up needed if patient is low-risk. Non-contrast chest CT can be considered in 12 months if patient is high-risk. This recommendation follows the consensus statement: Guidelines for Management of Incidental Pulmonary Nodules Detected on CT Images: From the Fleischner Society 2017; Radiology 2017; 284:228-243. Electronically Signed   By: Marlan Palau M.D.   On: 08/27/2018 08:26   Ct Angio Neck W And/or Wo Contrast  Result Date: 08/27/2018 CLINICAL DATA:  Left-sided weakness.  Slurred speech. EXAM: CT ANGIOGRAPHY HEAD AND NECK TECHNIQUE: Multidetector CT imaging of the head and neck was performed  using the standard protocol during bolus administration of intravenous contrast. Multiplanar CT image reconstructions and MIPs were obtained to evaluate the vascular anatomy. Carotid stenosis measurements (when applicable) are obtained utilizing NASCET criteria, using the distal internal carotid diameter as the denominator. CONTRAST:  ISOVUE-370 IOPAMIDOL (ISOVUE-370) INJECTION 76% COMPARISON:  CT head 08/27/2018 FINDINGS: CTA NECK FINDINGS Aortic arch: Standard branching. Imaged portion shows no evidence of aneurysm or dissection. No significant stenosis of the major arch vessel origins. Right carotid system: Right common carotid artery widely patent. Mild atherosclerotic plaque right carotid bifurcation without stenosis. Beaded irregularity of the right internal carotid artery at the C2 level compatible with fibromuscular dysplasia. Negative for dissection or stenosis. Left carotid system: Normal left carotid without stenosis or fibromuscular dysplasia. Vertebral arteries: Both vertebral arteries widely patent without stenosis Skeleton: Mild degenerative changes in the cervical spine. No acute skeletal abnormality. Other neck: Negative Upper chest: 4 mm nodule right upper lobe. Remaining upper lobes are clear. No effusion. Review of the MIP images confirms the above findings CTA HEAD FINDINGS Anterior circulation: Cavernous carotid normal bilaterally without stenosis. Anterior and middle cerebral arteries normal bilaterally without stenosis or occlusion. Posterior circulation: Both vertebral arteries patent to the basilar. PICA patent bilaterally. AICA  patent. Superior cerebellar and posterior cerebral arteries patent bilaterally. Basilar widely patent. Fetal origin of the right posterior cerebral artery. Venous sinuses: Patent Anatomic variants: None Delayed phase: Normal enhancement on delayed imaging. Mild chronic microvascular ischemic changes. Review of the MIP images confirms the above findings  IMPRESSION: 1. No significant carotid or vertebral artery stenosis in the neck. Mild atherosclerotic disease right carotid bifurcation. Fibromuscular dysplasia right internal carotid artery without stenosis or dissection 2. Negative for emergent large vessel occlusion. No significant intracranial stenosis. 3. 4 mm right upper lobe nodule No follow-up needed if patient is low-risk. Non-contrast chest CT can be considered in 12 months if patient is high-risk. This recommendation follows the consensus statement: Guidelines for Management of Incidental Pulmonary Nodules Detected on CT Images: From the Fleischner Society 2017; Radiology 2017; 284:228-243. Electronically Signed   By: Marlan Palau M.D.   On: 08/27/2018 08:26   Ct Head Code Stroke Wo Contrast  Result Date: 08/27/2018 CLINICAL DATA:  Code stroke. Slurred speech. Episode of left-sided weakness. EXAM: CT HEAD WITHOUT CONTRAST TECHNIQUE: Contiguous axial images were obtained from the base of the skull through the vertex without intravenous contrast. COMPARISON:  None. FINDINGS: Brain: No evidence of acute infarction, hemorrhage, hydrocephalus, extra-axial collection or mass lesion/mass effect. Mild chronic microvascular ischemia in the frontal white matter bilaterally. Small chronic infarct anterior limb internal capsule on the left. Vascular: Negative for hyperdense vessel Skull: Negative Sinuses/Orbits: Paranasal sinuses clear.  Bilateral cataract surgery Other: None ASPECTS ([place] Stroke Program Early CT Score) - Ganglionic level infarction (caudate, lentiform nuclei, internal capsule, insula, M1-M3 cortex): 7 - Supraganglionic infarction (M4-M6 cortex): 3 Total score (0-10 with 10 being normal): 10 IMPRESSION: 1. No acute abnormality. 2. Mild chronic ischemic change in the white matter and internal capsule on the left. 3. ASPECTS is 10 4. These results were called by telephone at the time of interpretation on 08/27/2018 at 7:00 am to Dr. Madilyn Hook , who  verbally acknowledged these results. Electronically Signed   By: Marlan Palau M.D.   On: 08/27/2018 07:00    PHYSICAL EXAM Pleasant middle-aged Caucasian lady currently not in distress. . Afebrile. Head is nontraumatic. Neck is supple without bruit.    Cardiac exam no murmur or gallop. Lungs are clear to auscultation. Distal pulses are well felt. Neurological Exam ;  Awake  Alert oriented x 3. Normal speech and language.eye movements full without nystagmus.fundi were not visualized. Vision acuity and fields appear normal. Hearing is normal. Palatal movements are normal. Face symmetric. Tongue midline. Normal strength, tone, reflexes and coordination. Normal sensation. Gait deferred.   ASSESSMENT/PLAN Melinda Burns is a 67 y.o. female with no significant past medical history  presenting with decreased sensation of the L and L sided weakness.   Stroke:  Small right basal ganglia infarct secondary to small vessel disease source  Code Stroke CT head No acute stroke. Mild chronic ischemic dz white matter and L internal capsule. ASPECTS 10.     CTA head & neck R ICA FMD. Mild R ICA atherosclerosis. 4 mm RUL nodule.  MRI  Small R basal ganglia infarct  2D Echo  EF 60-65%. No source of embolus   LDL 166  HgbA1c 5.3  Lovenox 40 mg sq daily for VTE prophylaxis  No antithrombotic prior to admission, now on aspirin 325 mg daily and clopidogrel 75 mg daily following a 300 mg plavix load Given mild stroke, recommend aspirin 81 mg and plavix 75 mg daily x 3 weeks, then  aspirin alone. Orders adjusted.    Therapy recommendations:  No therapy needs    Disposition:  pending  Follow-up Stroke Clinic at [facility] in 4 weeks. Office will call with appointment date and time. Order placed. Get a PCP for ongoing risk factor control   Elevated BP  No hx Hypertension  Not on home medications  Stable 120-150s now . Permissive hypertension (OK if < 220/120) but gradually  normalize in 5-7 days . Long-term BP goal normotensive . At followup, will check BP and determine if antihypertensives needed  Hyperlipidemia  Home meds:  No statin  Now on lipitor 40  LDL 166, goal < 70  Increase statin to 80 mg daily  Consider repatha if statin is not effective. Dr. Pearlean Brownie discussed with pt/husband  Continue statin at discharge  Other Stroke Risk Factors  Advanced age  ETOH use, advised to drink no more than 1 drink(s) a day  Family hx TIA  Hospital day # 1  Annie Main, MSN, APRN, ANVP-BC, AGPCNP-BC Advanced Practice Stroke Nurse [facility] See Amion for Schedule & Pager information 08/28/2018 11:44 AM  I have personally examined this patient, reviewed notes, independently viewed imaging studies, participated in medical decision making and plan of care.ROS completed by me personally and pertinent positives fully documented  I have made any additions or clarifications directly to the above note. Agree with note above.  She presented with left-sided weakness due to right brain subcortical infarct etiology likely small vessel disease. Continue dual antiplatelet therapy for 3 weeks followed by aspirin alone. Continue ongoing stroke workup. On discussion the patient and husband regarding the stroke and discussion about stroke prevention and treatment and answered questions. Discussed with Dr. Clearnce Sorrel. Greater than 50% time during this 25 minute visit was spent on counseling and coordination of care about lacunar infarct and answered questions  Delia Heady, MD Medical Director [facility] Pager: [phone] 08/28/2018 3:29 PM  To contact Stroke Continuity provider, please refer to [email or web address]. After hours, contact General Neurology

## 2018-08-28 NOTE — Plan of Care (Signed)
Adequate for discharge.

## 2018-08-28 NOTE — Discharge Summary (Signed)
Physician Discharge Summary  Melinda Burns GNF:[id number] DOB: 15-Apr-1951 DOA: 08/27/2018  PCP: Patient, No Pcp Per  Admit date: 08/27/2018 Discharge date: 08/28/2018  Admitted From: Home Disposition: Home  Recommendations for Outpatient Follow-up:  1. Follow up with PCP in 1-2 weeks 2. Please obtain BMP/CBC in one week 3. Please repeat your lipid panel in approximately 1 to 3 months while on your cholesterol medication 4. Please repeat a CT scan of your chest in 12 months to follow-up on a right upper lobe lung nodule 5. Please continue taking the aspirin and Plavix for a total of 3 weeks and then stop the Plavix and continue the aspirin 81 mg indefinitely   Home Health: No Equipment/Devices: None  Discharge Condition: Stable CODE STATUS: Full Diet recommendation: Heart Healthy   Brief/Interim Summary:  #) Acute CVA: Patient was admitted with left-sided weakness and facial droop.  CTA of the head and neck showed no significant carotid or vertebral stenosis in the neck.  Some incidental fibromuscular dysplasia of the right internal carotid artery was noted without stenosis or dissection.  Echo was unremarkable.  MRI of brain showed Acute/subacute nonhemorrhagic infarcts involving the posterior aspect of the right caudate lobe and lentiform nucleus.  Patient was discharged on aspirin and Plavix for 3 weeks and then plan to discontinue the Plavix and only remain on aspirin 81 mg.  Patient was also started on high-dose statin.  #) Right upper lobe pulmonary nodule: This was detected incidentally on CT angios.  It was 4 mm in size.  Patient will need repeat noncontrast chest CT in 12 months to follow-up  Discharge Diagnoses:  Active Problems:   Acute CVA (cerebrovascular accident) (HCC)   Elevated BP without diagnosis of hypertension   Hyperglycemia    Discharge Instructions  Discharge Instructions    Ambulatory referral to Neurology   Complete by:  As directed    Follow up  with stroke clinic NP (Jessica Vanschaick or Darrol Angel, if both not available, consider Dr. Delia Heady, Dr. Jamelle Rushing, or Dr. Naomie Dean) at [facility] in about 4 weeks.   Call MD for:  difficulty breathing, headache or visual disturbances   Complete by:  As directed    Call MD for:  extreme fatigue   Complete by:  As directed    Call MD for:  hives   Complete by:  As directed    Call MD for:  persistant dizziness or light-headedness   Complete by:  As directed    Call MD for:  persistant nausea and vomiting   Complete by:  As directed    Call MD for:  redness, tenderness, or signs of infection (pain, swelling, redness, odor or green/yellow discharge around incision site)   Complete by:  As directed    Call MD for:  severe uncontrolled pain   Complete by:  As directed    Call MD for:  temperature >100.4   Complete by:  As directed    Diet - low sodium heart healthy   Complete by:  As directed    Discharge instructions   Complete by:  As directed    Please follow-up with your PCP in 1 to 2 weeks.  Please follow-up with neurology clinic in approximately 6 weeks.  Please take the aspirin and Plavix for 3 weeks and then stop the Plavix and continue taking aspirin indefinitely.   Increase activity slowly   Complete by:  As directed      Allergies as of  08/28/2018      Reactions   Codeine Other (See Comments)   Nausea , Dizziness      Medication List    TAKE these medications   aspirin 81 MG EC tablet Take 1 tablet (81 mg total) by mouth daily. Start taking on:  08/29/2018   atorvastatin 80 MG tablet Commonly known as:  LIPITOR Take 1 tablet (80 mg total) by mouth daily at 6 PM.   clopidogrel 75 MG tablet Commonly known as:  PLAVIX Take 1 tablet (75 mg total) by mouth daily for 21 days. Start taking on:  08/29/2018      Follow-up Information    [facility] Follow up in 4 week(s).   Specialty:  Neurology Why:  stroke  clinic. office will call with appt date and time. Contact information: [address] [place] [address] [phone]         Allergies  Allergen Reactions  . Codeine Other (See Comments)    Nausea , Dizziness    Consultations: Neurology  Procedures/Studies: Ct Angio Head W Or Wo Contrast  Result Date: 08/27/2018 CLINICAL DATA:  Left-sided weakness.  Slurred speech. EXAM: CT ANGIOGRAPHY HEAD AND NECK TECHNIQUE: Multidetector CT imaging of the head and neck was performed using the standard protocol during bolus administration of intravenous contrast. Multiplanar CT image reconstructions and MIPs were obtained to evaluate the vascular anatomy. Carotid stenosis measurements (when applicable) are obtained utilizing NASCET criteria, using the distal internal carotid diameter as the denominator. CONTRAST:  ISOVUE-370 IOPAMIDOL (ISOVUE-370) INJECTION 76% COMPARISON:  CT head 08/27/2018 FINDINGS: CTA NECK FINDINGS Aortic arch: Standard branching. Imaged portion shows no evidence of aneurysm or dissection. No significant stenosis of the major arch vessel origins. Right carotid system: Right common carotid artery widely patent. Mild atherosclerotic plaque right carotid bifurcation without stenosis. Beaded irregularity of the right internal carotid artery at the C2 level compatible with fibromuscular dysplasia. Negative for dissection or stenosis. Left carotid system: Normal left carotid without stenosis or fibromuscular dysplasia. Vertebral arteries: Both vertebral arteries widely patent without stenosis Skeleton: Mild degenerative changes in the cervical spine. No acute skeletal abnormality. Other neck: Negative Upper chest: 4 mm nodule right upper lobe. Remaining upper lobes are clear. No effusion. Review of the MIP images confirms the above findings CTA HEAD FINDINGS Anterior circulation: Cavernous carotid normal bilaterally without stenosis. Anterior and middle cerebral  arteries normal bilaterally without stenosis or occlusion. Posterior circulation: Both vertebral arteries patent to the basilar. PICA patent bilaterally. AICA patent. Superior cerebellar and posterior cerebral arteries patent bilaterally. Basilar widely patent. Fetal origin of the right posterior cerebral artery. Venous sinuses: Patent Anatomic variants: None Delayed phase: Normal enhancement on delayed imaging. Mild chronic microvascular ischemic changes. Review of the MIP images confirms the above findings IMPRESSION: 1. No significant carotid or vertebral artery stenosis in the neck. Mild atherosclerotic disease right carotid bifurcation. Fibromuscular dysplasia right internal carotid artery without stenosis or dissection 2. Negative for emergent large vessel occlusion. No significant intracranial stenosis. 3. 4 mm right upper lobe nodule No follow-up needed if patient is low-risk. Non-contrast chest CT can be considered in 12 months if patient is high-risk. This recommendation follows the consensus statement: Guidelines for Management of Incidental Pulmonary Nodules Detected on CT Images: From the Fleischner Society 2017; Radiology 2017; 284:228-243. Electronically Signed   By: Marlan Palau M.D.   On: 08/27/2018 08:26   Ct Angio Neck W And/or Wo Contrast  Result Date: 08/27/2018 CLINICAL DATA:  Left-sided weakness.  Slurred speech. EXAM: CT ANGIOGRAPHY HEAD AND NECK TECHNIQUE: Multidetector CT imaging of the head and neck was performed using the standard protocol during bolus administration of intravenous contrast. Multiplanar CT image reconstructions and MIPs were obtained to evaluate the vascular anatomy. Carotid stenosis measurements (when applicable) are obtained utilizing NASCET criteria, using the distal internal carotid diameter as the denominator. CONTRAST:  ISOVUE-370 IOPAMIDOL (ISOVUE-370) INJECTION 76% COMPARISON:  CT head 08/27/2018 FINDINGS: CTA NECK FINDINGS Aortic arch: Standard  branching. Imaged portion shows no evidence of aneurysm or dissection. No significant stenosis of the major arch vessel origins. Right carotid system: Right common carotid artery widely patent. Mild atherosclerotic plaque right carotid bifurcation without stenosis. Beaded irregularity of the right internal carotid artery at the C2 level compatible with fibromuscular dysplasia. Negative for dissection or stenosis. Left carotid system: Normal left carotid without stenosis or fibromuscular dysplasia. Vertebral arteries: Both vertebral arteries widely patent without stenosis Skeleton: Mild degenerative changes in the cervical spine. No acute skeletal abnormality. Other neck: Negative Upper chest: 4 mm nodule right upper lobe. Remaining upper lobes are clear. No effusion. Review of the MIP images confirms the above findings CTA HEAD FINDINGS Anterior circulation: Cavernous carotid normal bilaterally without stenosis. Anterior and middle cerebral arteries normal bilaterally without stenosis or occlusion. Posterior circulation: Both vertebral arteries patent to the basilar. PICA patent bilaterally. AICA patent. Superior cerebellar and posterior cerebral arteries patent bilaterally. Basilar widely patent. Fetal origin of the right posterior cerebral artery. Venous sinuses: Patent Anatomic variants: None Delayed phase: Normal enhancement on delayed imaging. Mild chronic microvascular ischemic changes. Review of the MIP images confirms the above findings IMPRESSION: 1. No significant carotid or vertebral artery stenosis in the neck. Mild atherosclerotic disease right carotid bifurcation. Fibromuscular dysplasia right internal carotid artery without stenosis or dissection 2. Negative for emergent large vessel occlusion. No significant intracranial stenosis. 3. 4 mm right upper lobe nodule No follow-up needed if patient is low-risk. Non-contrast chest CT can be considered in 12 months if patient is high-risk. This  recommendation follows the consensus statement: Guidelines for Management of Incidental Pulmonary Nodules Detected on CT Images: From the Fleischner Society 2017; Radiology 2017; 284:228-243. Electronically Signed   By: Marlan Palau M.D.   On: 08/27/2018 08:26   Mr Brain Wo Contrast  Result Date: 08/28/2018 CLINICAL DATA:  Acute onset of left-sided weakness and left-sided facial droop. Slurred speech. EXAM: MRI HEAD WITHOUT CONTRAST TECHNIQUE: Multiplanar, multiecho pulse sequences of the brain and surrounding structures were obtained without intravenous contrast. COMPARISON:  CT head and CTA head and neck from the same day. FINDINGS: Brain: The diffusion-weighted images demonstrate an acute/subacute nonhemorrhagic infarct involving the posterior aspect of the right caudate nucleus and posterior lentiform nucleus. T2 signal changes are associated with both infarcts. Mild periventricular white matter changes are present in addition. No other acute or focal cortical abnormality is present. Basal ganglia are otherwise intact. The brainstem and cerebellum is normal. Focal T2 hyperintensity is present within the right lateral aspect of the pituitary gland measuring 2 x 2.5 mm on coronal imaging. There is no mass effect. Vascular: Flow is present in the major intracranial arteries. Skull and upper cervical spine: The skull base is within normal limits. Craniocervical junction is normal. Upper cervical spine is unremarkable. Marrow signal is normal. Sinuses/Orbits: The paranasal sinuses and mastoid air cells are clear. Bilateral lens replacements are present. The globes and orbits are otherwise within normal limits. IMPRESSION: 1. Acute/subacute nonhemorrhagic infarcts involving the posterior  aspect of the right caudate lobe and lentiform nucleus. This corresponds with the patient's acute symptoms. 2. Mild periventricular white matter changes are present otherwise. 3. No other acute intracranial abnormality. These  results will be called to the ordering clinician or representative by the Radiologist Assistant, and communication documented in the PACS or zVision Dashboard. Electronically Signed   By: Marin Roberts M.D.   On: 08/28/2018 12:39   Ct Head Code Stroke Wo Contrast  Result Date: 08/27/2018 CLINICAL DATA:  Code stroke. Slurred speech. Episode of left-sided weakness. EXAM: CT HEAD WITHOUT CONTRAST TECHNIQUE: Contiguous axial images were obtained from the base of the skull through the vertex without intravenous contrast. COMPARISON:  None. FINDINGS: Brain: No evidence of acute infarction, hemorrhage, hydrocephalus, extra-axial collection or mass lesion/mass effect. Mild chronic microvascular ischemia in the frontal white matter bilaterally. Small chronic infarct anterior limb internal capsule on the left. Vascular: Negative for hyperdense vessel Skull: Negative Sinuses/Orbits: Paranasal sinuses clear.  Bilateral cataract surgery Other: None ASPECTS (Alberta Stroke Program Early CT Score) - Ganglionic level infarction (caudate, lentiform nuclei, internal capsule, insula, M1-M3 cortex): 7 - Supraganglionic infarction (M4-M6 cortex): 3 Total score (0-10 with 10 being normal): 10 IMPRESSION: 1. No acute abnormality. 2. Mild chronic ischemic change in the white matter and internal capsule on the left. 3. ASPECTS is 10 4. These results were called by telephone at the time of interpretation on 08/27/2018 at 7:00 am to Dr. Madilyn Hook , who verbally acknowledged these results. Electronically Signed   By: Marlan Palau M.D.   On: 08/27/2018 07:00   Echo 08/27/2018:Study Conclusions  - Left ventricle: The cavity size was normal. There was mild   concentric hypertrophy. Systolic function was normal. The   estimated ejection fraction was in the range of 60% to 65%. Wall   motion was normal; there were no regional wall motion   abnormalities. - Aortic valve: Mildly calcified annulus. Trileaflet; normal   thickness  leaflets. There was mild regurgitation. - Aortic root: The aortic root was mildly dilated.  Impressions:  - No cardiac source of emboli was indentified.  Subjective:   Discharge Exam: Vitals:   08/28/18 0753 08/28/18 1154  BP: (!) 151/86 (!) 152/84  Pulse: 70 75  Resp: 16   Temp: 98.4 F (36.9 C) 98.4 F (36.9 C)  SpO2: 100% 96%   Vitals:   08/28/18 0014 08/28/18 0434 08/28/18 0753 08/28/18 1154  BP: 140/70 127/76 (!) 151/86 (!) 152/84  Pulse: 63 68 70 75  Resp: 18 18 16    Temp: 98.1 F (36.7 C) 98.4 F (36.9 C) 98.4 F (36.9 C) 98.4 F (36.9 C)  TempSrc: Oral Oral Oral Oral  SpO2: 98% 99% 100% 96%  Weight:      Height:        General: Pt is alert, awake, not in acute distress Cardiovascular: RRR, S1/S2 +, no rubs, no gallops Respiratory: CTA bilaterally, no wheezing, no rhonchi Abdominal: Soft, NT, ND, bowel sounds + Extremities: no edema, Neuro: Cranial nerves II through XII intact, bilateral upper extremity lower extremity symptoms 5 out of 5    The results of significant diagnostics from this hospitalization (including imaging, microbiology, ancillary and laboratory) are listed below for reference.     Microbiology: No results found for this or any previous visit (from the past 240 hour(s)).   Labs: BNP (last 3 results) No results for input(s): BNP in the last 8760 hours. Basic Metabolic Panel: Recent Labs  Lab 08/27/18 0636  NA 139  K 4.1  CL 102  CO2 26  GLUCOSE 122*  BUN 22  CREATININE 0.82  CALCIUM 9.2   Liver Function Tests: Recent Labs  Lab 08/27/18 0636  AST 29  ALT 23  ALKPHOS 86  BILITOT 0.5  PROT 7.2  ALBUMIN 3.9   No results for input(s): LIPASE, AMYLASE in the last 168 hours. No results for input(s): AMMONIA in the last 168 hours. CBC: Recent Labs  Lab 08/27/18 0636  WBC 6.1  NEUTROABS 3.0  HGB 14.6  HCT 42.9  MCV 89.7  PLT 342   Cardiac Enzymes: Recent Labs  Lab 08/27/18 0636  TROPONINI <0.03    BNP: Invalid input(s): POCBNP CBG: Recent Labs  Lab 08/27/18 0635  GLUCAP 117*   D-Dimer No results for input(s): DDIMER in the last 72 hours. Hgb A1c Recent Labs    08/28/18 0609  HGBA1C 5.3   Lipid Profile Recent Labs    08/28/18 0609  CHOL 256*  HDL 54  LDLCALC 166*  TRIG 179*  CHOLHDL 4.7   Thyroid function studies Recent Labs    08/27/18 1353  TSH 1.495   Anemia work up No results for input(s): VITAMINB12, FOLATE, FERRITIN, TIBC, IRON, RETICCTPCT in the last 72 hours. Urinalysis    Component Value Date/Time   COLORURINE YELLOW 08/27/2018 0730   APPEARANCEUR CLEAR 08/27/2018 0730   LABSPEC <1.005 (L) 08/27/2018 0730   PHURINE 7.0 08/27/2018 0730   GLUCOSEU NEGATIVE 08/27/2018 0730   HGBUR NEGATIVE 08/27/2018 0730   BILIRUBINUR NEGATIVE 08/27/2018 0730   KETONESUR NEGATIVE 08/27/2018 0730   PROTEINUR NEGATIVE 08/27/2018 0730   NITRITE NEGATIVE 08/27/2018 0730   LEUKOCYTESUR NEGATIVE 08/27/2018 0730   Sepsis Labs Invalid input(s): PROCALCITONIN,  WBC,  LACTICIDVEN Microbiology No results found for this or any previous visit (from the past 240 hour(s)).   Time coordinating discharge: 25  SIGNED:   Delaine Lame, MD  [facility] 08/28/2018, 1:43 PM  If 7PM-7AM, please contact night-coverage [email or web address] Password [email or web address]

## 2018-08-28 NOTE — Evaluation (Signed)
Physical Therapy Evaluation Patient Details Name: Melinda Burns MRN: [id number] DOB: 1951-05-12 Today's Date: 08/28/2018   History of Present Illness  Patient is a 67 y/o female who presents with left sided weakness, facial droop, and slurred speech. Head CT-unremarkable. Brain MRI-pending. No PMH.  Clinical Impression  Patient tolerated gait training, stair training and higher level balance activities without any deviations in gait. Pt independent PTA, works as an Airline pilot and active. Scored 23/24 on DGI. Pt reports all symptoms have resolved and she is functioning at baseline. Pt does not require skilled therapy services at this time. Education re: BeFAST. All education completed. Discharge from therapy.    Follow Up Recommendations No PT follow up    Equipment Recommendations  None recommended by PT    Recommendations for Other Services       Precautions / Restrictions Precautions Precautions: None Restrictions Weight Bearing Restrictions: No      Mobility  Bed Mobility               General bed mobility comments: Sitting EOB upon P Tarrival.   Transfers Overall transfer level: Independent                  Ambulation/Gait Ambulation/Gait assistance: Independent Gait Distance (Feet): 350 Feet Assistive device: None Gait Pattern/deviations: Step-through pattern   Gait velocity interpretation: >4.37 ft/sec, indicative of normal walking speed General Gait Details: steady gait, tolerated higher level balance challenges without difficulty.   Stairs Stairs: Yes Stairs assistance: Modified independent (Device/Increase time)   Number of Stairs: 4 General stair comments: Slow to descend.  Wheelchair Mobility    Modified Rankin (Stroke Patients Only) Modified Rankin (Stroke Patients Only) Pre-Morbid Rankin Score: No symptoms Modified Rankin: No significant disability     Balance Overall balance assessment: Needs assistance Sitting-balance  support: Feet supported;No upper extremity supported Sitting balance-Leahy Scale: Normal     Standing balance support: During functional activity Standing balance-Leahy Scale: Good               High level balance activites: Backward walking;Direction changes;Turns;Sudden stops;Head turns High Level Balance Comments: tolerated above with no deviations in gait.  Standardized Balance Assessment Standardized Balance Assessment : Dynamic Gait Index   Dynamic Gait Index Level Surface: Normal Change in Gait Speed: Normal Gait with Horizontal Head Turns: Normal Gait with Vertical Head Turns: Normal Gait and Pivot Turn: Normal Step Over Obstacle: Normal Step Around Obstacles: Normal Steps: Mild Impairment Total Score: 23       Pertinent Vitals/Pain Pain Assessment: No/denies pain    Home Living Family/patient expects to be discharged to:: Private residence Living Arrangements: Spouse/significant other Available Help at Discharge: Family;Available 24 hours/day Type of Home: House Home Access: Level entry     Home Layout: Two level Home Equipment: None      Prior Function Level of Independence: Independent         Comments: 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      Hand Dominance        Extremity/Trunk Assessment   Upper Extremity Assessment Upper Extremity Assessment: Defer to OT evaluation    Lower Extremity Assessment Lower Extremity Assessment: Overall WFL for tasks assessed    Cervical / Trunk Assessment Cervical / Trunk Assessment: Normal  Communication   Communication: No difficulties  Cognition Arousal/Alertness: Awake/alert Behavior During Therapy: WFL for tasks assessed/performed Overall Cognitive Status: Within Functional Limits for tasks assessed  General Comments      Exercises     Assessment/Plan    PT Assessment Patent does not need  any further PT services  PT Problem List         PT Treatment Interventions      PT Goals (Current goals can be found in the Care Plan section)  Acute Rehab PT Goals Patient Stated Goal: to get home PT Goal Formulation: All assessment and education complete, DC therapy    Frequency     Barriers to discharge        Co-evaluation               AM-PAC PT "6 Clicks" Daily Activity  Outcome Measure Difficulty turning over in bed (including adjusting bedclothes, sheets and blankets)?: None Difficulty moving from lying on back to sitting on the side of the bed? : None Difficulty sitting down on and standing up from a chair with arms (e.g., wheelchair, bedside commode, etc,.)?: None Help needed moving to and from a bed to chair (including a wheelchair)?: None Help needed walking in hospital room?: None Help needed climbing 3-5 steps with a railing? : None 6 Click Score: 24    End of Session Equipment Utilized During Treatment: Gait belt Activity Tolerance: Patient tolerated treatment well Patient left: in bed;with call bell/phone within reach Nurse Communication: Mobility status PT Visit Diagnosis: Muscle weakness (generalized) (M62.81)    Time: 1610-9604 PT Time Calculation (min) (ACUTE ONLY): 15 min   Charges:   PT Evaluation $PT Eval Low Complexity: 1 Low          Mylo Red, PT, DPT Acute Rehabilitation Services Pager [phone] Office [phone]      Blake Divine A Lanier Ensign 08/28/2018, 8:54 AM

## 2018-08-28 NOTE — Care Management CC44 (Signed)
Condition Code 44 Documentation Completed  Patient Details  Name: CARRIGAN FAHL MRN: [id number] Date of Birth: 1951/04/25   Condition Code 44 given:  Yes Patient signature on Condition Code 44 notice:  Yes Documentation of 2 MD's agreement:  Yes Code 44 added to claim:  Yes    Kermit Balo, RN 08/28/2018, 2:57 PM

## 2018-08-28 NOTE — Care Management Note (Signed)
Case Management Note  Patient Details  Name: Melinda Burns MRN: [id number] Date of Birth: October 08, 1951  Subjective/Objective:   Pt admitted with CVA. She is from home with her spouse.  Pt without a PCP.                Action/Plan: Pt discharging home with self care. No f/u per PT/OT. Per patient and spouse pt is going to get an appointment with Dr Oneta Rack for PCP.  Spouse to provide transportation home.   Expected Discharge Date:  08/28/18               Expected Discharge Plan:  Home/Self Care  In-House Referral:     Discharge planning Services     Post Acute Care Choice:    Choice offered to:     DME Arranged:    DME Agency:     HH Arranged:    HH Agency:     Status of Service:  Completed, signed off  If discussed at [employer] of Stay Meetings, dates discussed:    Additional Comments:  Kermit Balo, RN 08/28/2018, 3:11 PM

## 2018-08-28 NOTE — Progress Notes (Signed)
Nutrition Brief Note  RD consulted for TIA.  67 year old female who presented to the ED with weakness. Differential diagnosis suggestive of TIA versus stroke.  Spoke with pt and husband at bedside.  Diet Recall: Breakfast: oatmeal, fruit, and coffee Lunch: yogurt and fruit Dinner: salad, fruit, grilled meat, and starch  Pt reports typically having a good appetite and overall eating well. Reviewed diet recall with pt. Pt with knowledge regarding general healthful eating. Pt reports walking and working out daily for at least 1 hour.  Pt denies recent weight loss. Pt also denies difficulty chewing or swallowing.  Nutrition-focused physical exam completed. Findings are no fat depletion, no muscle depletion, and no edema.  Wt Readings from Last 15 Encounters:  08/27/18 63.5 kg    Body mass index is 23.3 kg/m. Patient meets criteria for normal weight based on current BMI.   Current diet order is Heart Healthy. No meal completion charted at this time. Pt states she ate well at breakfast.  Medications reviewed.  Labs reviewed: cholesterol 256 (H), LDL 166 (H), triglycerides 179 (H)  No nutrition interventions warranted at this time. If nutrition issues arise, please consult RD.    Earma Reading, MS, RD, LDN Pager: [phone] Weekend/After Hours: [phone]

## 2018-08-28 NOTE — Progress Notes (Signed)
OT Cancellation Note  Patient Details Name: Melinda Burns MRN: [id number] DOB: 05-08-1951   Cancelled Treatment:    Reason Eval/Treat Not Completed: OT screened, no needs identified, will sign off Spoke with PT and able to screen.  Felecia Shelling, OTR/L  Acute Rehabilitation Services Pager: [phone] Office: [phone] .  08/28/2018, 9:21 AM

## 2018-08-28 NOTE — Care Management Obs Status (Signed)
MEDICARE OBSERVATION STATUS NOTIFICATION   Patient Details  Name: Melinda Burns MRN: [id number] Date of Birth: Mar 20, 1951   Medicare Observation Status Notification Given:  Yes    Kermit Balo, RN 08/28/2018, 2:57 PM

## 2018-08-28 NOTE — Progress Notes (Addendum)
Discharge order received. Discharge instructions given to the patient and she verbalized understanding. Discharged via wheelchair.

## 2018-09-15 ENCOUNTER — Ambulatory Visit (INDEPENDENT_AMBULATORY_CARE_PROVIDER_SITE_OTHER): Payer: Medicare Other | Admitting: Internal Medicine

## 2018-09-15 ENCOUNTER — Encounter: Payer: Self-pay | Admitting: Internal Medicine

## 2018-09-15 VITALS — BP 142/94 | HR 72 | Temp 97.0°F | Resp 16 | Ht 65.0 in | Wt 142.6 lb

## 2018-09-15 DIAGNOSIS — E559 Vitamin D deficiency, unspecified: Secondary | ICD-10-CM

## 2018-09-15 DIAGNOSIS — I639 Cerebral infarction, unspecified: Secondary | ICD-10-CM

## 2018-09-15 DIAGNOSIS — I10 Essential (primary) hypertension: Secondary | ICD-10-CM | POA: Diagnosis not present

## 2018-09-15 DIAGNOSIS — Z79899 Other long term (current) drug therapy: Secondary | ICD-10-CM

## 2018-09-15 DIAGNOSIS — R7309 Other abnormal glucose: Secondary | ICD-10-CM | POA: Diagnosis not present

## 2018-09-15 DIAGNOSIS — G8190 Hemiplegia, unspecified affecting unspecified side: Secondary | ICD-10-CM

## 2018-09-15 DIAGNOSIS — E782 Mixed hyperlipidemia: Secondary | ICD-10-CM | POA: Diagnosis not present

## 2018-09-15 NOTE — Patient Instructions (Signed)

## 2018-09-15 NOTE — Progress Notes (Signed)
Post- Hospital Follow-Up     This very nice 67 y.o. MWF  was admitted to the hospital on  08/27/2018  and patient was discharged from the hospital on  08/28/2018. The patient now presents for follow up for transition from recent hospitalization.  The day after discharge  our clinical staff contacted the patient to assure stability and schedule a follow up appointment. The discharge summary, medications and diagnostic test results were reviewed before meeting with the patient. The patient was admitted for:   Hemiplegia of nondominant side due to acute cerebrovascular accident  Essential hypertension Hyperlipidemia, mixed Abnormal glucose Vitamin D deficiency    This nice 67 yo MWF who had not seen a doctor in over 17 years was hospitalized with a Rt Brain (caudate lobe)  CVA and Lt HP  U>L and was resolving and so  no interventions were initiated other than starting Atorvastatin and Plavix (for 3 weeks) & LD bASA. Patient admits that she still feels that her Lt arm>leg is weak with prolonged activity.       Hospitalization discharge instructions and medications are reconciled with the patient.      Patient is also followed with Hypertension, Hyperlipidemia, Pre-Diabetes and Vitamin D Deficiency.      Patient is treated for HTN & BP has been controlled at home. Today's BP is permissively elevated at 142/94. Patient has had no complaints of any cardiac type chest pain, palpitations, dyspnea/orthopnea/PND, dizziness, claudication, or dependent edema.     Hyperlipidemia was controlled with diet & meds were initiated in the hospital.  Patient denies myalgias or other med SE's. Last Lipids in hospital were not at goal: Lab Results  Component Value Date   CHOL 256 (H) 08/28/2018   HDL 54 08/28/2018   LDLCALC 166 (H) 08/28/2018   TRIG 179 (H) 08/28/2018   CHOLHDL 4.7 08/28/2018      Also, the patient has history of  abnormal glucose in the hospital and has had no symptoms of reactive hypoglycemia,  diabetic polys, paresthesias or visual blurring.  Recent  A1c was  Lab Results  Component Value Date   HGBA1C 5.3 08/28/2018      Further, the patient is expectant to have Vitamin D Deficiency as she does not supplement.  Current Outpatient Medications on File Prior to Visit  Medication Sig  . aspirin EC 81 MG EC tablet Take 1 tablet (81 mg total) by mouth daily.  Marland Kitchen. atorvastatin (LIPITOR) 80 MG tablet Take 1 tablet (80 mg total) by mouth daily at 6 PM.  . clopidogrel (PLAVIX) 75 MG tablet Take 1 tablet (75 mg total) by mouth daily for 21 days.   Allergies  Allergen Reactions  . Codeine Other (See Comments)    Nausea , Dizziness   PMHx:  History reviewed. No pertinent past medical history.  There is no immunization history on file for this patient.   Past Surgical History:  Procedure Laterality Date  . cataracts     FHx:    Reviewed  SHx:    Reviewed   Systems Review:  Constitutional: Denies fever, chills, wt changes, headaches, insomnia, fatigue, night sweats, change in appetite. Eyes: Denies redness, blurred vision, diplopia, discharge, itchy, watery eyes.  ENT: Denies discharge, congestion, post nasal drip, epistaxis, sore throat, earache, hearing loss, dental pain, tinnitus, vertigo, sinus pain, snoring.  CV: Denies chest pain, palpitations, irregular heartbeat, syncope, dyspnea, diaphoresis, orthopnea, PND, claudication or edema. Respiratory: denies cough, dyspnea, DOE, pleurisy, hoarseness, laryngitis, wheezing.  Gastrointestinal: Denies  dysphagia, odynophagia, heartburn, reflux, water brash, abdominal pain or cramps, nausea, vomiting, bloating, diarrhea, constipation, hematemesis, melena, hematochezia  or hemorrhoids. Genitourinary: Denies dysuria, frequency, urgency, nocturia, hesitancy, discharge, hematuria or flank pain. Musculoskeletal: Denies arthralgias, myalgias, stiffness, jt. swelling, pain, limping or strain/sprain.  Skin: Denies pruritus, rash, hives, warts,  acne, eczema or change in skin lesion(s). Neuro: No weakness, tremor, incoordination, spasms, paresthesia or pain. Psychiatric: Denies confusion, memory loss or sensory loss. Endo: Denies change in weight, skin or hair change.  Heme/Lymph: No excessive bleeding, bruising or enlarged lymph nodes.  Physical Exam  BP (!) 142/94   Pulse 72   Temp (!) 97 F (36.1 C)   Resp 16   Ht 5\' 5"  (1.651 m)   Wt 142 lb 9.6 oz (64.7 kg)   BMI 23.73 kg/m   Appears well nourished, well groomed  and in no distress.  Eyes: PERRLA, EOMs, conjunctiva no swelling or erythema. Sinuses: No frontal/maxillary tenderness ENT/Mouth: EAC's clear, TM's nl w/o erythema, bulging. Nares clear w/o erythema, swelling, exudates. Oropharynx clear without erythema or exudates. Oral hygiene is good. Tongue normal, non obstructing. Hearing intact.  Neck: Supple. Thyroid nl. Car 2+/2+ without bruits, nodes or JVD. Chest: Respirations nl with BS clear & equal w/o rales, rhonchi, wheezing or stridor.  Cor: Heart sounds normal w/ regular rate and rhythm without sig. murmurs, gallops, clicks or rubs. Peripheral pulses normal and equal  without edema.  Abdomen: Soft & bowel sounds normal. Non-tender w/o guarding, rebound, hernias, masses or organomegaly.  Lymphatics: Unremarkable.  Musculoskeletal: Full ROM all peripheral extremities, joint stability, 5/5 strength and normal gait.  Skin: Warm, dry without exposed rashes, lesions or ecchymosis apparent.  Neuro: Cranial nerves intact, reflexes equal bilaterally. Sensory-motor testing grossly intact. Tendon reflexes grossly intact. speech fluent. Psch: Alert & oriented x 3.  Insight and judgement nl & appropriate. No ideations.  Assessment and Plan:  1. Hemiplegia of nondominant side due to acute cerebrovascular accident (CVA) (HCC)  - Sedimentation rate - C-reactive protein  2. Essential hypertension  - Continue medication, monitor blood pressure at home.  - Continue DASH  diet. Reminder to go to the ER if any CP,  SOB, nausea, dizziness, severe HA, changes vision/speech.  - Magnesium  3. Hyperlipidemia, mixed  - Continue diet/meds, exercise,& lifestyle modifications.  - Continue monitor periodic cholesterol/liver & renal functions   4. Abnormal glucose  - Continue diet, exercise, lifestyle modifications.   - Monitor appropriate labs in f/u   5. Vitamin D deficiency  - Start supplementation. - VITAMIN D 25 Hydroxyl  6. Medication management  - Magnesium - VITAMIN D 25 Hydroxyl - Sedimentation rate - C-reactive protein       Discussed  regular exercise, BP monitoring, weight control to achieve/maintain BMI less than 25 and discussed meds and SE's. Recommended labs to assess and monitor clinical status with further disposition pending results of labs. Over 30 minutes of exam, counseling, chart review was performed. Recc 1 month f/u.

## 2018-09-16 LAB — C-REACTIVE PROTEIN: CRP: 0.9 mg/L (ref <8.0)

## 2018-09-16 LAB — MAGNESIUM: Magnesium: 2.2 mg/dL (ref 1.5–2.5)

## 2018-09-16 LAB — VITAMIN D 25 HYDROXY (VIT D DEFICIENCY, FRACTURES): Vit D, 25-Hydroxy: 35 ng/mL (ref 30–100)

## 2018-09-16 LAB — SEDIMENTATION RATE: Sed Rate: 36 mm/h — ABNORMAL HIGH (ref 0–30)

## 2018-10-06 ENCOUNTER — Ambulatory Visit (INDEPENDENT_AMBULATORY_CARE_PROVIDER_SITE_OTHER): Payer: Medicare Other | Admitting: Adult Health

## 2018-10-06 ENCOUNTER — Encounter: Payer: Self-pay | Admitting: Adult Health

## 2018-10-06 VITALS — BP 132/84 | HR 78 | Ht 65.0 in | Wt 142.0 lb

## 2018-10-06 DIAGNOSIS — I63511 Cerebral infarction due to unspecified occlusion or stenosis of right middle cerebral artery: Secondary | ICD-10-CM | POA: Diagnosis not present

## 2018-10-06 DIAGNOSIS — R739 Hyperglycemia, unspecified: Secondary | ICD-10-CM

## 2018-10-06 DIAGNOSIS — I10 Essential (primary) hypertension: Secondary | ICD-10-CM

## 2018-10-06 NOTE — Patient Instructions (Signed)
Continue aspirin 81 mg daily  and lipitor 80mg  for secondary stroke prevention  Continue to follow up with PCP regarding cholesterol and blood pressure management   Continue to stay active and maintain a healthy diet  Continue to monitor blood pressure at home  Maintain strict control of hypertension with blood pressure goal below 130/90, diabetes with hemoglobin A1c goal below 6.5% and cholesterol with LDL cholesterol (bad cholesterol) goal below 70 mg/dL. I also advised the patient to eat a healthy diet with plenty of whole grains, cereals, fruits and vegetables, exercise regularly and maintain ideal body weight.  Followup in the future with me in 6 months or call earlier if needed        Thank you for coming to see us at [facility]. I hope we have been able to provide you high quality care today.  You may receive a patient satisfaction survey over the next few weeks. We would appreciate your feedback and comments so that we may continue to improve ourselves and the health of our patients.  

## 2018-10-06 NOTE — Progress Notes (Signed)
[facility] [address] [place]. [place] [address] [phone]       OFFICE FOLLOW UP NOTE  Ms. ADONIS RYTHER Date of Birth:  [birth year] Medical Record Number:  [id number]   Reason for Referral:  hospital stroke follow up  CHIEF COMPLAINT:  Chief Complaint  Patient presents with  . Follow-up    Hospital stroke follow up patient seen by Dr. Pearlean Brownie room 9 patient alone    HPI: Melinda Burns is being seen today for initial visit in the office for right BG infarct secondary to small vessel disease on 08/27/2018. History obtained from patient and chart review. Reviewed all radiology images and labs personally.  Ms. Melinda Burns is a 67 y.o. female with no significant past medical history who presented with decreased sensation on the left and left sided weakness along along with noted dysarthria and slight left facial droop.  CT head reviewed and was negative for acute infarct. CTA head and neck showed right ICA FMD, mild right ICA arthrosclerosis and 4 mm right upper lobe nodule.  MRI brain reviewed and showed small right basal ganglia infarct.  2D echo showed an EF of 60 to 65% without evidence of cardiac source of embolus.  LDL 166 and recommended initiation of Lipitor 80 mg daily with possible consideration of starting Repatha if statin is not effective.  Elevated BP on admission and recommended continue follow-up with PCP outpatient for possible need of initiating antihypertensives.  A1c satisfactory at 5.3.  Patient was not on antithrombotic PTA and recommended DAPT for 3 weeks and aspirin alone.  Patient was discharged home in stable condition without therapy needs.  Patient is being seen today for hospital follow-up and overall has been doing well without residual deficits or recurring of symptoms.  She has completed 3 weeks DAPT and currently on aspirin 81 mg alone without side effects of bleeding or bruising.  Has continued to take atorvastatin 80 mg without  side effects of myalgias.  Blood pressure today satisfactory at 132/84.  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  She has returned back to all previous activities along with exercising frequently and maintaining a healthy diet.  Denies new or worsening stroke/TIA symptoms.  ROS:   14 system review of systems performed and negative with exception of no complaints  PMH:  Past Medical History:  Diagnosis Date  . Stroke [facility])     PSH:  Past Surgical History:  Procedure Laterality Date  . cataract surgery    . cataracts      Social History:  Social History   Socioeconomic History  . Marital status: Married    Spouse name: Not on file  . Number of children: Not on file  . Years of education: Not on file  . Highest education level: Not on file  Occupational History  . Occupation: Audiological scientist  Social Needs  . Financial resource strain: Not on file  . Food insecurity:    Worry: Not on file    Inability: Not on file  . Transportation needs:    Medical: Not on file    Non-medical: Not on file  Tobacco Use  . Smoking status: Never Smoker  . Smokeless tobacco: Never Used  Substance and Sexual Activity  . Alcohol use: Yes    Comment: occasional wine  . Drug use: Never  . Sexual activity: Not on file  Lifestyle  .  Physical activity:    Days per week: Not on file    Minutes per session: Not on file  . Stress: Not on file  Relationships  . Social connections:    Talks on phone: Not on file    Gets together: Not on file    Attends religious service: Not on file    Active member of club or organization: Not on file    Attends meetings of clubs or organizations: Not on file    Relationship status: Not on file  . Intimate partner violence:    Fear of current or ex partner: Not on file    Emotionally abused: Not on file    Physically abused: Not on file    Forced sexual activity:  Not on file  Other Topics Concern  . Not on file  Social History Narrative  . Not on file    Family History:  Family History  Problem Relation Age of Onset  . Transient ischemic attack Mother   . COPD Mother   . Stroke Maternal Grandfather     Medications:   Current Outpatient Medications on File Prior to Visit  Medication Sig Dispense Refill  . aspirin EC 81 MG EC tablet Take 1 tablet (81 mg total) by mouth daily. 30 tablet 1  . atorvastatin (LIPITOR) 80 MG tablet Take 1 tablet (80 mg total) by mouth daily at 6 PM. 30 tablet 1   No current facility-administered medications on file prior to visit.     Allergies:   Allergies  Allergen Reactions  . Codeine Other (See Comments)    Nausea , Dizziness     Physical Exam  Vitals:   10/06/18 1507  BP: 132/84  Pulse: 78  Weight: 142 lb (64.4 kg)  Height: 5\' 5"  (1.651 m)   Body mass index is 23.63 kg/m. No exam data present  General: well developed, well nourished, pleasant middle-aged Caucasian female, seated, in no evident distress Head: head normocephalic and atraumatic.   Neck: supple with no carotid or supraclavicular bruits Cardiovascular: regular rate and rhythm, no murmurs Musculoskeletal: no deformity Skin:  no rash/petichiae Vascular:  Normal pulses all extremities  Neurologic Exam Mental Status: Awake and fully alert. Oriented to place and time. Recent and remote memory intact. Attention span, concentration and fund of knowledge appropriate. Mood and affect appropriate.  Cranial Nerves: Fundoscopic exam reveals sharp disc margins. Pupils equal, briskly reactive to light. Extraocular movements full without nystagmus. Visual fields full to confrontation. Hearing intact. Facial sensation intact. Face, tongue, palate moves normally and symmetrically.  Motor: Normal bulk and tone. Normal strength in all tested extremity muscles. Sensory.: intact to touch , pinprick , position and vibratory sensation.    Coordination: Rapid alternating movements normal in all extremities. Finger-to-nose and heel-to-shin performed accurately bilaterally. Gait and Station: Arises from chair without difficulty. Stance is normal. Gait demonstrates normal stride length and balance . Able to heel, toe and tandem walk without difficulty.  Reflexes: 1+ and symmetric. Toes downgoing.    NIHSS  0 Modified Rankin  0    Diagnostic Data (Labs, Imaging, Testing)  CT HEAD WO CONTRAST 08/27/2018 IMPRESSION: 1. No acute abnormality. 2. Mild chronic ischemic change in the white matter and internal capsule on the left. 3. ASPECTS is 10  MR BRAIN WO CONTRAST 08/28/2018 IMPRESSION: 1. Acute/subacute nonhemorrhagic infarcts involving the posterior aspect of the right caudate lobe and lentiform nucleus. This corresponds with the patient's acute symptoms. 2. Mild periventricular white matter changes are present  otherwise. 3. No other acute intracranial abnormality.  CT ANGIO HEAD W OR WO CONTRAST CT ANGIO NECK W OR WO CONTRAST 08/27/2018 IMPRESSION: 1. No significant carotid or vertebral artery stenosis in the neck. Mild atherosclerotic disease right carotid bifurcation. Fibromuscular dysplasia right internal carotid artery without stenosis or dissection 2. Negative for emergent large vessel occlusion. No significant intracranial stenosis. 3. 4 mm right upper lobe nodule No follow-up needed if patient is low-risk. Non-contrast chest CT can be considered in 12 months if patient is high-risk. This recommendation follows the consensus statement: Guidelines for Management of Incidental Pulmonary Nodules Detected on CT Images: From the Fleischner Society 2017; Radiology 2017; 284:228-243.  ECHOCARDIOGRAM 08/27/2018 Study Conclusions - Left ventricle: The cavity size was normal. There was mild   concentric hypertrophy. Systolic function was normal. The   estimated ejection fraction was in the range of 60% to 65%.  Wall   motion was normal; there were no regional wall motion   abnormalities. - Aortic valve: Mildly calcified annulus. Trileaflet; normal   thickness leaflets. There was mild regurgitation. - Aortic root: The aortic root was mildly dilated.     ASSESSMENT: Melinda Burns is a 67 y.o. year old female here with right basal ganglia infarct on 08/27/2018 secondary to small vessel disease. Vascular risk factors include HTN and HLD.  Patient is being seen today for hospital follow-up and overall has been doing well without residual deficit or recurring of symptoms.    PLAN: -Continue aspirin 81 mg daily  and atorvastatin 80 mg for secondary stroke prevention -F/u with PCP regarding your HLD and HTN management -continue to monitor BP at home -advised to continue to stay active and maintain a healthy diet -Maintain strict control of hypertension with blood pressure goal below 130/90, diabetes with hemoglobin A1c goal below 6.5% and cholesterol with LDL cholesterol (bad cholesterol) goal below 70 mg/dL. I also advised the patient to eat a healthy diet with plenty of whole grains, cereals, fruits and vegetables, exercise regularly and maintain ideal body weight.  Follow up in 6 months or call earlier if needed   Greater than 50% of time during this 25 minute visit was spent on counseling,explanation of diagnosis of right BG infarct, reviewing risk factor management of SVT, HTN and HLD, planning of further management, discussion with patient and family and coordination of care    George Hugh, AGNP-BC  [facility] [address] [place], [place] [address]  Phone [phone] Fax [phone] Note: This document was prepared with digital dictation and possible smart phrase technology. Any transcriptional errors that result from this process are unintentional.

## 2018-10-06 NOTE — Progress Notes (Signed)
I agree with the above plan 

## 2018-10-13 ENCOUNTER — Ambulatory Visit (INDEPENDENT_AMBULATORY_CARE_PROVIDER_SITE_OTHER): Payer: Medicare Other | Admitting: Internal Medicine

## 2018-10-13 VITALS — BP 138/78 | HR 64 | Temp 97.2°F | Resp 16 | Ht 65.0 in | Wt 144.4 lb

## 2018-10-13 DIAGNOSIS — I10 Essential (primary) hypertension: Secondary | ICD-10-CM

## 2018-10-13 DIAGNOSIS — E782 Mixed hyperlipidemia: Secondary | ICD-10-CM

## 2018-10-13 DIAGNOSIS — I639 Cerebral infarction, unspecified: Secondary | ICD-10-CM

## 2018-10-13 DIAGNOSIS — E559 Vitamin D deficiency, unspecified: Secondary | ICD-10-CM

## 2018-10-13 NOTE — Progress Notes (Signed)
  Subjective:    Patient ID: Melinda Burns, female    DOB: [birth year], 67 y.o.   MRN: [id number]  HPI   Patient is a very nice 67 yo MWF with mild labile HTN an moderately severe HLD who was hospitalized  9/5-6 with a small Rt brain CVA& Ly HP which recovered. Patient was found to have moderately severe elevated Chol 256 and LDL 166 and was started on Lipitor 80 mg. BP was mildly elevated at 142/94 at last OV 1 month ago. Vit D measured very low at 35 and she was advised to tale Vit D 5,000 units/daily. Patient has been monitoring BP's at random time And the reportedly have been WNL.   Medication Sig  . aspirin EC 81 MG EC tablet Take 1 tablet  daily.   Vitamin D 5,000 units  Take 1 capsule daily  . atorvastatin  80 MG tablet Take 1 tablet daily    Allergies  Allergen Reactions  . Codeine Other (See Comments)    Nausea , Dizziness   Past Medical History:  Diagnosis Date  . Stroke [facility])    Past Surgical History:  Procedure Laterality Date  . cataract surgery    . cataracts     Review of Systems     10 point systems review negative except as above.    Objective:   Physical Exam  BP 138/78   Pulse 64   Temp (!) 97.2 F (36.2 C)   Resp 16   Ht 5\' 5"  (1.651 m)   Wt 144 lb 6.4 oz (65.5 kg)   BMI 24.03 kg/m   HEENT - WNL. Neck - supple.  Chest - Clear equal BS. Cor - Nl HS. RRR w/o sig MGR. PP 1(+). No edema. MS- FROM w/o deformities.  Gait Nl. Neuro -  Nl w/o focal abnormalities.     Assessment & Plan:   1. Essential hypertension  2. Hyperlipidemia, mixed  3. Vitamin D deficiency  4. Cerebrovascular accident (CVA)  (HCC)  - Advised continue meds same , discussing meds/SE's & recc return 3 months.

## 2018-10-19 ENCOUNTER — Other Ambulatory Visit: Payer: Self-pay | Admitting: *Deleted

## 2018-10-19 MED ORDER — ATORVASTATIN CALCIUM 80 MG PO TABS: 80.0000 mg | ORAL_TABLET | Freq: Every day | ORAL | 1 refills | Status: DC

## 2018-12-17 ENCOUNTER — Other Ambulatory Visit: Payer: Self-pay | Admitting: Internal Medicine

## 2019-01-18 ENCOUNTER — Ambulatory Visit (INDEPENDENT_AMBULATORY_CARE_PROVIDER_SITE_OTHER): Payer: Medicare Other | Admitting: Internal Medicine

## 2019-01-18 ENCOUNTER — Encounter: Payer: Self-pay | Admitting: Internal Medicine

## 2019-01-18 VITALS — BP 114/86 | HR 67 | Temp 97.3°F | Ht 65.0 in | Wt 141.0 lb

## 2019-01-18 DIAGNOSIS — I639 Cerebral infarction, unspecified: Secondary | ICD-10-CM | POA: Diagnosis not present

## 2019-01-18 DIAGNOSIS — R7309 Other abnormal glucose: Secondary | ICD-10-CM | POA: Diagnosis not present

## 2019-01-18 DIAGNOSIS — Z79899 Other long term (current) drug therapy: Secondary | ICD-10-CM | POA: Diagnosis not present

## 2019-01-18 DIAGNOSIS — E782 Mixed hyperlipidemia: Secondary | ICD-10-CM | POA: Diagnosis not present

## 2019-01-18 DIAGNOSIS — I10 Essential (primary) hypertension: Secondary | ICD-10-CM

## 2019-01-18 DIAGNOSIS — E559 Vitamin D deficiency, unspecified: Secondary | ICD-10-CM | POA: Diagnosis not present

## 2019-01-18 NOTE — Patient Instructions (Signed)

## 2019-01-18 NOTE — Progress Notes (Signed)
This very nice 68 y.o. MWF presents for 3 month follow up with HTN, HLD, Pre-Diabetes and Vitamin D Deficiency.      Patient was 1st seen in Sept 2019, after a hospitalization with a Rt brain CVA and mild Lt HP - which resolved.      Patient is followed expectantly for labile  HTN & BP has been controlled at home. Today's BP is at goal - 114/86. Patient has had no complaints of any cardiac type chest pain, palpitations, dyspnea / orthopnea / PND, dizziness, claudication, or dependent edema.     Hyperlipidemia is controlled with diet & meds. Patient denies myalgias or other med SE's. Last Lipids were  Lab Results  Component Value Date   CHOL 256 (H) 08/28/2018   HDL 54 08/28/2018   LDLCALC 166 (H) 08/28/2018   TRIG 179 (H) 08/28/2018   CHOLHDL 4.7 08/28/2018      Also, the patient is screened for glucose intolerance and has had no symptoms of reactive hypoglycemia, diabetic polys, paresthesias or visual blurring.  Last A1c was Normal & at goal:  Lab Results  Component Value Date   HGBA1C 5.3 08/28/2018      Further, the patient also has history of Vitamin D Deficiency ("35" / Sept / 2019) and supplements vitamin D without any suspected side-effects.   Current Outpatient Medications on File Prior to Visit  Medication Sig  . aspirin EC 81 MG EC tablet Take 1 tablet (81 mg total) by mouth daily.  Marland Kitchen atorvastatin (LIPITOR) 80 MG tablet TAKE 1 TABLET ONCE DAILY AT 6 PM.   No current facility-administered medications on file prior to visit.    Allergies  Allergen Reactions  . Codeine Other (See Comments)    Nausea , Dizziness   PMHx:   Past Medical History:  Diagnosis Date  . Stroke [facility])     There is no immunization history on file for this patient. Past Surgical History:  Procedure Laterality Date  . cataract surgery    . cataracts     FHx:    Reviewed / unchanged  SHx:    Reviewed / unchanged   Systems Review:  Constitutional: Denies fever, chills, wt changes,  headaches, insomnia, fatigue, night sweats, change in appetite. Eyes: Denies redness, blurred vision, diplopia, discharge, itchy, watery eyes.  ENT: Denies discharge, congestion, post nasal drip, epistaxis, sore throat, earache, hearing loss, dental pain, tinnitus, vertigo, sinus pain, snoring.  CV: Denies chest pain, palpitations, irregular heartbeat, syncope, dyspnea, diaphoresis, orthopnea, PND, claudication or edema. Respiratory: denies cough, dyspnea, DOE, pleurisy, hoarseness, laryngitis, wheezing.  Gastrointestinal: Denies dysphagia, odynophagia, heartburn, reflux, water brash, abdominal pain or cramps, nausea, vomiting, bloating, diarrhea, constipation, hematemesis, melena, hematochezia  or hemorrhoids. Genitourinary: Denies dysuria, frequency, urgency, nocturia, hesitancy, discharge, hematuria or flank pain. Musculoskeletal: Denies arthralgias, myalgias, stiffness, jt. swelling, pain, limping or strain/sprain.  Skin: Denies pruritus, rash, hives, warts, acne, eczema or change in skin lesion(s). Neuro: No weakness, tremor, incoordination, spasms, paresthesia or pain. Psychiatric: Denies confusion, memory loss or sensory loss. Endo: Denies change in weight, skin or hair change.  Heme/Lymph: No excessive bleeding, bruising or enlarged lymph nodes.  Physical Exam  BP 114/86   P 67   T  97.3 F    Ht 5\' 5"   Wt 141 lb   SpO2 93%   BMI 23.46   BP rechecked at 140/77  Appears  well nourished, well groomed  and in no distress.  Eyes: PERRLA, EOMs, conjunctiva  no swelling or erythema. Sinuses: No frontal/maxillary tenderness ENT/Mouth: EAC's clear, TM's nl w/o erythema, bulging. Nares clear w/o erythema, swelling, exudates. Oropharynx clear without erythema or exudates. Oral hygiene is good. Tongue normal, non obstructing. Hearing intact.  Neck: Supple. Thyroid not palpable. Car 2+/2+ without bruits, nodes or JVD. Chest: Respirations nl with BS clear & equal w/o rales, rhonchi,  wheezing or stridor.  Cor: Heart sounds normal w/ regular rate and rhythm without sig. murmurs, gallops, clicks or rubs. Peripheral pulses normal and equal  without edema.  Abdomen: Soft & bowel sounds normal. Non-tender w/o guarding, rebound, hernias, masses or organomegaly.  Lymphatics: Unremarkable.  Musculoskeletal: Full ROM all peripheral extremities, joint stability, 5/5 strength and normal gait.  Skin: Warm, dry without exposed rashes, lesions or ecchymosis apparent.  Neuro: Cranial nerves intact, reflexes equal bilaterally. Sensory-motor testing grossly intact. Tendon reflexes grossly intact.  Pysch: Alert & oriented x 3.  Insight and judgement nl & appropriate. No ideations.  Assessment and Plan:  1. Essential hypertension  - Continue medication, monitor blood pressure at home.  - Continue DASH diet.  Reminder to go to the ER if any CP,  SOB, nausea, dizziness, severe HA, changes vision/speech.  - CBC with Differential/Platelet - COMPLETE METABOLIC PANEL WITH GFR - Magnesium - TSH  2. Hyperlipidemia, mixed  - Continue diet/meds, exercise,& lifestyle modifications.  - Continue monitor periodic cholesterol/liver & renal functions   - Lipid panel - TSH  3. Abnormal glucose  - Continue diet, exercise  - Lifestyle modifications.  - Monitor appropriate labs.  - Hemoglobin A1c - Insulin, random  4. Vitamin D deficiency  - Continue supplementation.  - VITAMIN D 25 Hydroxyl  5. Cerebrovascular accident (CVA) (HCC)  6. Medication management  - CBC with Differential/Platelet - COMPLETE METABOLIC PANEL WITH GFR - Magnesium - Lipid panel - TSH - Hemoglobin A1c - Insulin, random - VITAMIN D 25 Hydroxyl       Discussed  regular exercise, BP monitoring, weight control to achieve/maintain BMI less than 25 and discussed med and SE's. Recommended labs to assess and monitor clinical status with further disposition pending results of labs. Over 30 minutes of exam,  counseling, chart review was performed.

## 2019-01-19 LAB — CBC WITH DIFFERENTIAL/PLATELET
ABSOLUTE MONOCYTES: 420 {cells}/uL (ref 200–950)
BASOS PCT: 0.3 %
Basophils Absolute: 18 cells/uL (ref 0–200)
Eosinophils Absolute: 90 cells/uL (ref 15–500)
Eosinophils Relative: 1.5 %
HCT: 42.2 % (ref 35.0–45.0)
HEMOGLOBIN: 14.2 g/dL (ref 11.7–15.5)
Lymphs Abs: 2244 cells/uL (ref 850–3900)
MCH: 30.1 pg (ref 27.0–33.0)
MCHC: 33.6 g/dL (ref 32.0–36.0)
MCV: 89.6 fL (ref 80.0–100.0)
MONOS PCT: 7 %
MPV: 9.7 fL (ref 7.5–12.5)
NEUTROS ABS: 3228 {cells}/uL (ref 1500–7800)
Neutrophils Relative %: 53.8 %
PLATELETS: 342 10*3/uL (ref 140–400)
RBC: 4.71 10*6/uL (ref 3.80–5.10)
RDW: 12.2 % (ref 11.0–15.0)
Total Lymphocyte: 37.4 %
WBC: 6 10*3/uL (ref 3.8–10.8)

## 2019-01-19 LAB — COMPLETE METABOLIC PANEL WITH GFR
AG RATIO: 1.4 (calc) (ref 1.0–2.5)
ALT: 37 U/L — AB (ref 6–29)
AST: 32 U/L (ref 10–35)
Albumin: 4.4 g/dL (ref 3.6–5.1)
Alkaline phosphatase (APISO): 112 U/L (ref 33–130)
BUN: 14 mg/dL (ref 7–25)
CALCIUM: 9.8 mg/dL (ref 8.6–10.4)
CO2: 32 mmol/L (ref 20–32)
CREATININE: 0.86 mg/dL (ref 0.50–0.99)
Chloride: 100 mmol/L (ref 98–110)
GFR, EST NON AFRICAN AMERICAN: 69 mL/min/{1.73_m2} (ref >=60)
GFR, Est African American: 80 mL/min/{1.73_m2} (ref >=60)
GLOBULIN: 3.2 g/dL (ref 1.9–3.7)
Glucose, Bld: 87 mg/dL (ref 65–99)
POTASSIUM: 4.2 mmol/L (ref 3.5–5.3)
SODIUM: 141 mmol/L (ref 135–146)
TOTAL PROTEIN: 7.6 g/dL (ref 6.1–8.1)
Total Bilirubin: 0.4 mg/dL (ref 0.2–1.2)

## 2019-01-19 LAB — LIPID PANEL
Cholesterol: 137 mg/dL (ref <200)
HDL: 51 mg/dL (ref >50)
LDL CHOLESTEROL (CALC): 64 mg/dL
NON-HDL CHOLESTEROL (CALC): 86 mg/dL (ref <130)
Total CHOL/HDL Ratio: 2.7 (calc) (ref <5.0)
Triglycerides: 134 mg/dL (ref <150)

## 2019-01-19 LAB — MAGNESIUM: Magnesium: 2.4 mg/dL (ref 1.5–2.5)

## 2019-01-19 LAB — HEMOGLOBIN A1C
Hgb A1c MFr Bld: 5.4 % of total Hgb (ref <5.7)
MEAN PLASMA GLUCOSE: 108 (calc)
eAG (mmol/L): 6 (calc)

## 2019-01-19 LAB — INSULIN, RANDOM: Insulin: 4.2 u[IU]/mL (ref 2.0–19.6)

## 2019-01-19 LAB — VITAMIN D 25 HYDROXY (VIT D DEFICIENCY, FRACTURES): Vit D, 25-Hydroxy: 19 ng/mL — ABNORMAL LOW (ref 30–100)

## 2019-01-19 LAB — TSH: TSH: 2.43 mIU/L (ref 0.40–4.50)

## 2019-01-23 ENCOUNTER — Other Ambulatory Visit: Payer: Self-pay | Admitting: Internal Medicine

## 2019-03-30 ENCOUNTER — Telehealth: Payer: Self-pay | Admitting: Adult Health

## 2019-03-30 NOTE — Telephone Encounter (Signed)
03-30-2019 Pt has called back and wants a tele visit.  pt has given verbal consent to file insurance for tele visit/LB

## 2019-03-30 NOTE — Telephone Encounter (Signed)
LVM to discuss changing appt to a video or tele visit

## 2019-04-07 ENCOUNTER — Other Ambulatory Visit: Payer: Self-pay

## 2019-04-07 ENCOUNTER — Ambulatory Visit (INDEPENDENT_AMBULATORY_CARE_PROVIDER_SITE_OTHER): Payer: Medicare Other | Admitting: Adult Health

## 2019-04-07 ENCOUNTER — Encounter: Payer: Self-pay | Admitting: Adult Health

## 2019-04-07 DIAGNOSIS — R739 Hyperglycemia, unspecified: Secondary | ICD-10-CM | POA: Diagnosis not present

## 2019-04-07 DIAGNOSIS — I63511 Cerebral infarction due to unspecified occlusion or stenosis of right middle cerebral artery: Secondary | ICD-10-CM | POA: Diagnosis not present

## 2019-04-07 DIAGNOSIS — I10 Essential (primary) hypertension: Secondary | ICD-10-CM

## 2019-04-07 NOTE — Progress Notes (Signed)
lipitor every other day LDL 64  Blood pressure - not routinely monitoring    [facility] [address] [place] [id number] [phone]     Virtual Visit via Telephone Note  I connected with Melinda Burns on 04/07/19 at  3:45 PM EDT by telephone located remotely within my own home and verified that I am speaking with the correct person using two identifiers who reports being located within her own home.    I discussed the limitations, risks, security and privacy concerns of performing an evaluation and management service by telephone and the availability of in person appointments. I also discussed with the patient that there may be a patient responsible charge related to this service. The patient expressed understanding and agreed to proceed.   History of Present Illness:  Melinda Burns is a 68 y.o. female who has been followed in this office for right basal ganglia infarct in 08/2018 secondary to small vessel disease.  She was initially scheduled for face-to-face office visit today at this time but due to COVID19, face-to-face office visit rescheduled for non-face-to-face telephone visit.  She continues to do well from a stroke standpoint without residual deficits or reoccurring symptoms.  She continues on aspirin 81 mg and atorvastatin without side effects.  She does not routinely monitor BP at home as this is always been stable for her.  She did have recent lipid panel obtained by PCP with LDL 64 and therefore recommended taking atorvastatin 80 mg every other day.  She continues to do all prior activities without any difficulties.  No further concerns at this time.      Observations/Objective:  General: Pleasant middle-aged Caucasian female asking and answering questions appropriately throughout conversation  Lipid Panel     Component Value Date/Time   CHOL 137 01/18/2019 1635   TRIG 134 01/18/2019 1635   HDL 51 01/18/2019 1635   CHOLHDL 2.7  01/18/2019 1635   VLDL 36 08/28/2018 0609   LDLCALC 64 01/18/2019 1635    CBC    Component Value Date/Time   WBC 6.0 01/18/2019 1635   RBC 4.71 01/18/2019 1635   HGB 14.2 01/18/2019 1635   HCT 42.2 01/18/2019 1635   PLT 342 01/18/2019 1635   MCV 89.6 01/18/2019 1635   MCH 30.1 01/18/2019 1635   MCHC 33.6 01/18/2019 1635   RDW 12.2 01/18/2019 1635   LYMPHSABS 2,244 01/18/2019 1635   MONOABS 0.7 08/27/2018 0636   EOSABS 90 01/18/2019 1635   BASOSABS 18 01/18/2019 1635    CMP     Component Value Date/Time   NA 141 01/18/2019 1635   K 4.2 01/18/2019 1635   CL 100 01/18/2019 1635   CO2 32 01/18/2019 1635   GLUCOSE 87 01/18/2019 1635   BUN 14 01/18/2019 1635   CREATININE 0.86 01/18/2019 1635   CALCIUM 9.8 01/18/2019 1635   PROT 7.6 01/18/2019 1635   ALBUMIN 3.9 08/27/2018 0636   AST 32 01/18/2019 1635   ALT 37 (H) 01/18/2019 1635   ALKPHOS 86 08/27/2018 0636   BILITOT 0.4 01/18/2019 1635   GFRNONAA 69 01/18/2019 1635   GFRAA 80 01/18/2019 1635        Assessment and Plan:  Melinda Burns is a 68 y.o. year old female here with right basal ganglia infarct on 08/27/2018 secondary to small vessel disease. Vascular risk factors include HTN and HLD.    She continues to do well from a stroke standpoint without residual deficits or reoccurring of symptoms.  -  Continue aspirin 81 mg daily  and atorvastatin 80 mg for secondary stroke prevention -F/u with PCP regarding your HLD and HTN management -continue to monitor BP at home -advised to continue to stay active and maintain a healthy diet -Maintain strict control of hypertension with blood pressure goal below 130/90, diabetes with hemoglobin A1c goal below 6.5% and cholesterol with LDL cholesterol (bad cholesterol) goal below 70 mg/dL. I also advised the patient to eat a healthy diet with plenty of whole grains, cereals, fruits and vegetables, exercise regularly and maintain ideal body weight.   Follow Up Instructions:    Stable from a stroke standpoint and recommended follow-up in office as needed but to contact us with any questions or concerns regarding her stroke    I discussed the assessment and treatment plan with the patient.  The patient was provided an opportunity to ask questions and all were answered to their satisfaction. The patient agreed with the plan and verbalized an understanding of the instructions.   I provided 24 minutes of non-face-to-face time during this encounter.    George Hugh, AGNP-BC  [facility] [address] [place], [place] [address]  Phone [phone] Fax [phone] Note: This document was prepared with digital dictation and possible smart phrase technology. Any transcriptional errors that result from this process are unintentional.

## 2019-04-08 NOTE — Progress Notes (Signed)
I agree with the above plan 

## 2019-05-04 ENCOUNTER — Ambulatory Visit: Payer: Self-pay | Admitting: Adult Health

## 2019-06-11 NOTE — Progress Notes (Signed)
MEDICARE ANNUAL WELLNESS VISIT AND FOLLOW UP  Assessment:    Melinda Burns was seen today for follow-up and medicare wellness.  Diagnoses and all orders for this visit:  Encounter for Medicare annual wellness exam Declines colon cancer screening, breast cancer screening - would not pursue treatment patient declines a colonoscopy even though the risks and benefits were discussed at length. Colon cancer is 3rd most diagnosed cancer and 2nd leading cause of death in both men and women 82 years of age and older. Patient understands the risk of cancer and death with declining the test, declines cologuard or hemoccult as well.  She declines all vaccines today; she would obtain tetanus booster if needed for dirty wound.   History of CVA (cerebrovascular accident) without residual deficits Continue ASA, statin Control blood pressure, cholesterol, glucose, increase exercise.   Essential hypertension At goal off of medications at this time; monitor Monitor blood pressure at home; call if consistently over 130/80 Continue DASH diet.   Reminder to go to the ER if any CP, SOB, nausea, dizziness, severe HA, changes vision/speech, left arm numbness and tingling and jaw pain.  Hyperlipidemia, mixed Continue medications: atorvastatin Continue low cholesterol diet and exercise.  Check lipid panel.   Vitamin D deficiency She is not currently on supplement; hx of intolerance with capsules Suggested she try liquid drops Start with 1000-2000 IU daily; if tolerating, titrate up to 5000 IU Goal is 60-100; recheck at CPE  BMI 23.0-23.9, adult Continue to recommend diet heavy in fruits and veggies and low in animal meats, cheeses, and dairy products, appropriate calorie intake Discuss exercise recommendations routinely Continue to monitor weight at each visit  Medication management CBC, CMP/GFR  Over 40 minutes of exam, counseling, chart review and critical decision making was performed Future  Appointments  Date Time Provider [place]  08/09/2019  3:45 PM Unk Pinto, MD [facility] None     Plan:   During the course of the visit the patient was educated and counseled about appropriate screening and preventive services including:    Pneumococcal vaccine   Prevnar 13  Influenza vaccine  Td vaccine  Screening electrocardiogram  Bone densitometry screening  Colorectal cancer screening  Diabetes screening  Glaucoma screening  Nutrition counseling   Advanced directives: requested   Subjective:  Melinda Burns is a 68 y.o. female who presents for Medicare Annual Wellness Visit and 3 month follow up.   She is still working, works in [employer].  She is new to our office this past year after experiencing a right basal ganglia infarct in 08/2018 secondary to small vessel disease following not presenting for medical screening or management for several decades.  She continues to do well from a stroke standpoint without residual deficits or reoccurring symptoms.  She continues on aspirin 81 mg and atorvastatin.   BMI is Body mass index is 23.63 kg/m., she has been working on diet and exercise, exercises 40 min daily, avoids fast food, fried food, <6 ounce of red meat weekly, fish and chicken, low fat daily, mainly fresh fruits and vegetables.  Wt Readings from Last 3 Encounters:  06/14/19 142 lb (64.4 kg)  01/18/19 141 lb (64 kg)  10/13/18 144 lb 6.4 oz (65.5 kg)   Bps have typically been at goal; today their BP is BP: 134/80 She does workout. She denies chest pain, shortness of breath, dizziness.   She is on cholesterol medication (atorvastatin 80 mg every other day since last visit) and denies myalgias. Her cholesterol is  at goal. The cholesterol last visit was:   Lab Results  Component Value Date   CHOL 137 01/18/2019   HDL 51 01/18/2019   LDLCALC 64 01/18/2019   TRIG 134 01/18/2019   CHOLHDL 2.7 01/18/2019    She has been  working on diet and exercise for glucose management. Last A1C in the office was:  Lab Results  Component Value Date   HGBA1C 5.4 01/18/2019   Last GFR: Lab Results  Component Value Date   GFRNONAA 69 01/18/2019   Patient is on Vitamin D supplement.   Lab Results  Component Value Date   VD25OH 19 (L) 01/18/2019      Medication Review: Current Outpatient Medications on File Prior to Visit  Medication Sig Dispense Refill  . aspirin EC 81 MG EC tablet Take 1 tablet (81 mg total) by mouth daily. 30 tablet 1  . atorvastatin (LIPITOR) 80 MG tablet TAKE 1 TABLET ONCE DAILY AT 6 PM. (Patient taking differently: Take one tablet every other day) 90 tablet 1   No current facility-administered medications on file prior to visit.     Allergies  Allergen Reactions  . Codeine Other (See Comments)    Nausea , Dizziness    Current Problems (verified) Patient Active Problem List   Diagnosis Date Noted  . Hyperlipidemia, mixed 10/13/2018  . Vitamin D deficiency 10/13/2018  . Essential hypertension 09/15/2018  . History of CVA (cerebrovascular accident) without residual deficits 08/28/2018    Screening Tests  There is no immunization history on file for this patient.  Preventative care: Last colonoscopy: declines, would not get treatment Last mammogram: declines, would not get treatment  Last pap smear/pelvic exam: remote, last 25 years ago, never abnormal, declines further  DEXA: declines   Prior vaccinations: DECLINES ALL VACCINES TD or Tdap: declines  Influenza: declines   Pneumococcal: declines Prevnar13: declines Shingles/Zostavax: declines   Names of Other Physician/Practitioners you currently use: 1. [facility] here for primary care 2. [facility], eye doctor, last visit ?2016, no issues  3. Dr. Delane GingerGill, dentist, last visit 2020, this AM  Patient Care Team: Lucky CowboyMcKeown, William, MD as PCP - General (Internal  Medicine)  SURGICAL HISTORY She  has a past surgical history that includes cataracts and cataract surgery. FAMILY HISTORY Her family history includes COPD in her mother; Heart attack (age of onset: 5363) in her mother; Hyperlipidemia in her maternal grandfather; Stroke in her maternal grandfather; Transient ischemic attack in her mother. SOCIAL HISTORY She  reports that she has never smoked. She has never used smokeless tobacco. She reports current alcohol use. She reports that she does not use drugs.   MEDICARE WELLNESS OBJECTIVES: Physical activity: Current Exercise Habits: Home exercise routine, Type of exercise: treadmill;strength training/weights;calisthenics, Time (Minutes): 40, Frequency (Times/Week): 7, Weekly Exercise (Minutes/Week): 280, Intensity: Mild, Exercise limited by: None identified Cardiac risk factors: Cardiac Risk Factors include: advanced age (>6155men, 78>65 women);dyslipidemia;hypertension;family history of premature cardiovascular disease;smoking/ tobacco exposure(second hand smoke exposure)  Depression/mood screen:   Dep[facility] screen [facility] 06/14/2019  Decreased Interest 0  Down, Depressed, Hopeless 0  PHQ - 2 Score 0    ADLs:  In your present state of health, do you have any difficulty performing the following activities: 06/14/2019 01/18/2019  Hearing? N N  Vision? N N  Difficulty concentrating or making decisions? N N  Walking or climbing stairs? N N  Dressing or bathing? N N  Doing errands, shopping? N N  Some recent data  might be hidden     Cognitive Testing  Alert? Yes  Normal Appearance?Yes  Oriented to person? Yes  Place? Yes   Time? Yes  Recall of three objects?  Yes  Can perform simple calculations? Yes  Displays appropriate judgment?Yes  Can read the correct time from a watch face?Yes  EOL planning: Does Patient Have a Medical Advance Directive?: Yes Type of Advance Directive: Living will, Healthcare Power of Attorney Does patient want to make  changes to medical advance directive?: Yes (MAU/Ambulatory/Procedural Areas - Information given)  Review of Systems  Constitutional: Negative for malaise/fatigue and weight loss.  HENT: Negative for hearing loss and tinnitus.   Eyes: Negative for blurred vision and double vision.  Respiratory: Negative for cough, shortness of breath and wheezing.   Cardiovascular: Negative for chest pain, palpitations, orthopnea, claudication and leg swelling.  Gastrointestinal: Negative for abdominal pain, blood in stool, constipation, diarrhea, heartburn, melena, nausea and vomiting.  Genitourinary: Negative.   Musculoskeletal: Negative for joint pain and myalgias.  Skin: Negative for rash.  Neurological: Negative for dizziness, tingling, sensory change, weakness and headaches.  Endo/Heme/Allergies: Negative for polydipsia.  Psychiatric/Behavioral: Negative.   All other systems reviewed and are negative.    Objective:     Today's Vitals   06/14/19 1517  BP: 134/80  Pulse: 69  Temp: 97.7 F (36.5 C)  SpO2: 97%  Weight: 142 lb (64.4 kg)  Height: 5\' 5"  (1.651 m)   Body mass index is 23.63 kg/m.  General appearance: alert, no distress, WD/WN, female HEENT: normocephalic, sclerae anicteric, TMs pearly, nares patent, no discharge or erythema, pharynx normal Oral cavity: MMM, no lesions Neck: supple, no lymphadenopathy, no thyromegaly, no masses Heart: RRR, normal S1, S2, no murmurs Lungs: CTA bilaterally, no wheezes, rhonchi, or rales Abdomen: +bs, soft, non tender, non distended, no masses, no hepatomegaly, no splenomegaly Musculoskeletal: nontender, no swelling, no obvious deformity Extremities: no edema, no cyanosis, no clubbing Pulses: 2+ symmetric, upper and lower extremities, normal cap refill Neurological: alert, oriented x 3, CN2-12 intact, strength normal upper extremities and lower extremities, sensation normal throughout, DTRs 2+ throughout, no cerebellar signs, gait  normal Psychiatric: normal affect, behavior normal, pleasant   Medicare Attestation I have personally reviewed: The patient's medical and social history Their use of alcohol, tobacco or illicit drugs Their current medications and supplements The patient's functional ability including ADLs,fall risks, home safety risks, cognitive, and hearing and visual impairment Diet and physical activities Evidence for depression or mood disorders  The patient's weight, height, BMI, and visual acuity have been recorded in the chart.  I have made referrals, counseling, and provided education to the patient based on review of the above and I have provided the patient with a written personalized care plan for preventive services.     Dan MakerAshley C Gardner Servantes, NP   06/14/2019

## 2019-06-14 ENCOUNTER — Other Ambulatory Visit: Payer: Self-pay

## 2019-06-14 ENCOUNTER — Ambulatory Visit (INDEPENDENT_AMBULATORY_CARE_PROVIDER_SITE_OTHER): Payer: Medicare Other | Admitting: Adult Health

## 2019-06-14 ENCOUNTER — Encounter: Payer: Self-pay | Admitting: Adult Health

## 2019-06-14 VITALS — BP 134/80 | HR 69 | Temp 97.7°F | Ht 65.0 in | Wt 142.0 lb

## 2019-06-14 DIAGNOSIS — I10 Essential (primary) hypertension: Secondary | ICD-10-CM | POA: Diagnosis not present

## 2019-06-14 DIAGNOSIS — E559 Vitamin D deficiency, unspecified: Secondary | ICD-10-CM | POA: Diagnosis not present

## 2019-06-14 DIAGNOSIS — Z0000 Encounter for general adult medical examination without abnormal findings: Secondary | ICD-10-CM

## 2019-06-14 DIAGNOSIS — E782 Mixed hyperlipidemia: Secondary | ICD-10-CM | POA: Diagnosis not present

## 2019-06-14 DIAGNOSIS — Z6823 Body mass index (BMI) 23.0-23.9, adult: Secondary | ICD-10-CM

## 2019-06-14 DIAGNOSIS — Z79899 Other long term (current) drug therapy: Secondary | ICD-10-CM

## 2019-06-14 DIAGNOSIS — R6889 Other general symptoms and signs: Secondary | ICD-10-CM

## 2019-06-14 DIAGNOSIS — Z0001 Encounter for general adult medical examination with abnormal findings: Secondary | ICD-10-CM | POA: Diagnosis not present

## 2019-06-14 DIAGNOSIS — I63511 Cerebral infarction due to unspecified occlusion or stenosis of right middle cerebral artery: Secondary | ICD-10-CM

## 2019-06-14 DIAGNOSIS — Z8673 Personal history of transient ischemic attack (TIA), and cerebral infarction without residual deficits: Secondary | ICD-10-CM

## 2019-06-14 NOTE — Patient Instructions (Addendum)
  Ms. Legrande , Thank you for taking time to come for your Medicare Wellness Visit. I appreciate your ongoing commitment to your health goals. Please review the following plan we discussed and let me know if I can assist you in the future.   These are the goals we discussed: Goals    . LDL CALC < 100       This is a list of the screening recommended for you and due dates:  Health Maintenance  Topic Date Due  . Tetanus Vaccine  06/13/2020*  .  Hepatitis C: One time screening is recommended by Center for Disease Control  (CDC) for  adults born from [birth year] through 1966.   06/13/2020*  . Flu Shot  07/24/2019  . Mammogram  Discontinued  . DEXA scan (bone density measurement)  Discontinued  . Colon Cancer Screening  Discontinued  . Pneumonia vaccines  Discontinued  *Topic was postponed. The date shown is not the original due date.     Look into vitamin D drops daily for supplementation - common daily need for women is 2000-5000 IU daily (start with 2000 IU and can titrate up to 5000 IU if tolerating)    Know what a healthy weight is for you (roughly BMI <25) and aim to maintain this  Aim for 7+ servings of fruits and vegetables daily  65-80+ fluid ounces of water or unsweet tea for healthy kidneys  Limit to max 1 drink of alcohol per day; avoid smoking/tobacco  Limit animal fats in diet for cholesterol and heart health - choose grass fed whenever available  Avoid highly processed foods, and foods high in saturated/trans fats  Aim for low stress - take time to unwind and care for your mental health  Aim for 150 min of moderate intensity exercise weekly for heart health, and weights twice weekly for bone health  Aim for 7-9 hours of sleep daily

## 2019-06-15 LAB — CBC WITH DIFFERENTIAL/PLATELET
Absolute Monocytes: 489 cells/uL (ref 200–950)
Basophils Absolute: 27 cells/uL (ref 0–200)
Basophils Relative: 0.4 %
Eosinophils Absolute: 87 cells/uL (ref 15–500)
Eosinophils Relative: 1.3 %
HCT: 41.2 % (ref 35.0–45.0)
Hemoglobin: 14.1 g/dL (ref 11.7–15.5)
Lymphs Abs: 2874 cells/uL (ref 850–3900)
MCH: 30.8 pg (ref 27.0–33.0)
MCHC: 34.2 g/dL (ref 32.0–36.0)
MCV: 90 fL (ref 80.0–100.0)
MPV: 9.7 fL (ref 7.5–12.5)
Monocytes Relative: 7.3 %
Neutro Abs: 3223 cells/uL (ref 1500–7800)
Neutrophils Relative %: 48.1 %
Platelets: 299 10*3/uL (ref 140–400)
RBC: 4.58 10*6/uL (ref 3.80–5.10)
RDW: 12.6 % (ref 11.0–15.0)
Total Lymphocyte: 42.9 %
WBC: 6.7 10*3/uL (ref 3.8–10.8)

## 2019-06-15 LAB — COMPLETE METABOLIC PANEL WITH GFR
AG Ratio: 1.8 (calc) (ref 1.0–2.5)
ALT: 28 U/L (ref 6–29)
AST: 32 U/L (ref 10–35)
Albumin: 4.5 g/dL (ref 3.6–5.1)
Alkaline phosphatase (APISO): 88 U/L (ref 37–153)
BUN: 12 mg/dL (ref 7–25)
CO2: 31 mmol/L (ref 20–32)
Calcium: 9.7 mg/dL (ref 8.6–10.4)
Chloride: 103 mmol/L (ref 98–110)
Creat: 0.92 mg/dL (ref 0.50–0.99)
GFR, Est African American: 74 mL/min/{1.73_m2} (ref >=60)
GFR, Est Non African American: 64 mL/min/{1.73_m2} (ref >=60)
Globulin: 2.5 g/dL (calc) (ref 1.9–3.7)
Glucose, Bld: 77 mg/dL (ref 65–99)
Potassium: 4.5 mmol/L (ref 3.5–5.3)
Sodium: 141 mmol/L (ref 135–146)
Total Bilirubin: 0.3 mg/dL (ref 0.2–1.2)
Total Protein: 7 g/dL (ref 6.1–8.1)

## 2019-06-15 LAB — LIPID PANEL
Cholesterol: 163 mg/dL (ref <200)
HDL: 64 mg/dL (ref >=50)
LDL Cholesterol (Calc): 78 mg/dL (calc)
Non-HDL Cholesterol (Calc): 99 mg/dL (calc) (ref <130)
Total CHOL/HDL Ratio: 2.5 (calc) (ref <5.0)
Triglycerides: 119 mg/dL (ref <150)

## 2019-06-15 LAB — TSH: TSH: 1.87 mIU/L (ref 0.40–4.50)

## 2019-08-09 ENCOUNTER — Encounter: Payer: Self-pay | Admitting: Internal Medicine

## 2019-12-09 ENCOUNTER — Encounter: Payer: Self-pay | Admitting: Internal Medicine

## 2020-01-08 ENCOUNTER — Other Ambulatory Visit: Payer: Self-pay | Admitting: Internal Medicine

## 2020-01-18 ENCOUNTER — Encounter: Payer: Self-pay | Admitting: Internal Medicine

## 2020-01-18 NOTE — Patient Instructions (Addendum)
- Link to sign up at [place] Web site for the Covid[place] vaccine   [email or web address]   Or call [phone]  ++++++++++++++++++++++++++++++++  Vit D  & Vit C 1,000 mg   are recommended to help protect  against the Covid-19 and other Corona viruses.    Also it's recommended  to take  Zinc 50 mg  to help  protect against the Covid-19   and best place to get  is also on Amazon.com  and don't pay more than 6-8 cents /pill !  ================================ Coronavirus (COVID-19) Are you at risk?  Are you at risk for the Coronavirus (COVID-19)?  To be considered HIGH RISK for Coronavirus (COVID-19), you have to meet the following criteria:  . Traveled to [place], [place], [place], [place] or [place]; or in the [place] to [place], [place], [place]  . or [place]; and have fever, cough, and shortness of breath within the last 2 weeks of travel OR . Been in close contact with a person diagnosed with COVID-19 within the last 2 weeks and have  . fever, cough,and shortness of breath .  . IF YOU DO NOT MEET THESE CRITERIA, YOU ARE CONSIDERED LOW RISK FOR COVID-19.  What to do if you are HIGH RISK for COVID-19?  . If you are having a medical emergency, call 911. . Seek medical care right away. Before you go to a doctor's office, urgent care or emergency department, .  call ahead and tell them about your recent travel, contact with someone diagnosed with COVID-19  .  and your symptoms.  . You should receive instructions from your physician's office regarding next steps of care.  . When you arrive at healthcare provider, tell the healthcare staff immediately you have returned from  . visiting [place], [place], [place], [place] or [place]; or traveled in the [place] to [place], [place],  . [place] or [place] in the last two  weeks or you have been in close contact with a person diagnosed with  . COVID-19 in the last 2 weeks.   . Tell the health care staff about your symptoms: fever, cough and shortness of breath. . After you have been seen by a medical provider, you will be either: o Tested for (COVID-19) and discharged home on quarantine except to seek medical care if  o symptoms worsen, and asked to  - Stay home and avoid contact with others until you get your results (4-5 days)  - Avoid travel on public transportation if possible (such as bus, train, or airplane) or o Sent to the Emergency Department by EMS for evaluation, COVID-19 testing  and  o possible admission depending on your condition and test results.  What to do if you are LOW RISK for COVID-19?  Reduce your risk of any infection by using the same precautions used for avoiding the common cold or flu:  . Wash your hands often with soap and warm water for at least 20 seconds.  If soap and water are not readily available,  . use an alcohol-based hand sanitizer with at least 60% alcohol.  . If coughing or sneezing, cover your mouth and nose by coughing or sneezing into the elbow areas of your shirt or coat, .  into a tissue or into your sleeve (not your hands). . Avoid shaking hands with others and consider head nods or verbal greetings only. . Avoid touching your eyes, nose, or mouth with unwashed hands.  .   Avoid close contact with people who are sick. . Avoid places or events with large numbers of people in one location, like concerts or sporting events. . Carefully consider travel plans you have or are making. . If you are planning any travel outside or inside the [place], visit the CDC's Travelers' Health webpage for the latest health notices. . If you have some symptoms but not all symptoms, continue to monitor at home and seek medical attention  . if your symptoms worsen. . If you are having a medical emergency, call  911.   . >>>>>>>>>>>>>>>>>>>>>>>>>>>>>>>>> . We Do NOT Approve of  [facility], [employer]  Our Patients  To Do Home Visits & We Do NOT Approve of [employer] > > > > > > > > > > > > > > > > > > > > > > > > > > > > > > > > > > > > > > >  Preventive Care for Adults  A healthy lifestyle and preventive care can promote health and wellness. Preventive health guidelines for women include the following key practices.  A routine yearly physical is a good way to check with your health care provider about your health and preventive screening. It is a chance to share any concerns and updates on your health and to receive a thorough exam.  Visit your dentist for a routine exam and preventive care every 6 months. Brush your teeth twice a day and floss once a day. Good oral hygiene prevents tooth decay and gum disease.  The frequency of eye exams is based on your age, health, family medical history, use of contact lenses, and other factors. Follow your health care provider's recommendations for frequency of eye exams.  Eat a healthy diet. Foods like vegetables, fruits, whole grains, low-fat dairy products, and lean protein foods contain the nutrients you need without too many calories. Decrease your intake of foods high in solid fats, added sugars, and salt. Eat the right amount of calories for you. Get information about a proper diet from your health care provider, if necessary.  Regular physical exercise is one of the most important things you can do for your health. Most adults should get at least 150 minutes of moderate-intensity exercise (any activity that increases your heart rate and causes you to sweat) each week. In addition, most adults need muscle-strengthening exercises on 2 or more days a week.  Maintain a healthy weight. The body mass index (BMI) is a screening tool to identify possible weight problems. It provides an estimate of body fat based on height and  weight. Your health care provider can find your BMI and can help you achieve or maintain a healthy weight. For adults 20 years and older:  A BMI below 18.5 is considered underweight.  A BMI of 18.5 to 24.9 is normal.  A BMI of 25 to 29.9 is considered overweight.  A BMI of 30 and above is considered obese.  Maintain normal blood lipids and cholesterol levels by exercising and minimizing your intake of saturated fat. Eat a balanced diet with plenty of fruit and vegetables. If your lipid or cholesterol levels are high, you are over 50, or you are at high risk for heart disease, you may need your cholesterol levels checked more frequently. Ongoing high lipid and cholesterol levels should be treated with medicines if diet and exercise are not working.  If you smoke, find out from your health care provider how to quit. If you  do not use tobacco, do not start.  Lung cancer screening is recommended for adults aged 55-80 years who are at high risk for developing lung cancer because of a history of smoking. A yearly low-dose CT scan of the lungs is recommended for people who have at least a 30-pack-year history of smoking and are a current smoker or have quit within the past 15 years. A pack year of smoking is smoking an average of 1 pack of cigarettes a day for 1 year (for example: 1 pack a day for 30 years or 2 packs a day for 15 years). Yearly screening should continue until the smoker has stopped smoking for at least 15 years. Yearly screening should be stopped for people who develop a health problem that would prevent them from having lung cancer treatment.  Avoid use of street drugs. Do not share needles with anyone. Ask for help if you need support or instructions about stopping the use of drugs.  High blood pressure causes heart disease and increases the risk of stroke.  Ongoing high blood pressure should be treated with medicines if weight loss and exercise do not work.  If you are 55-79 years  old, ask your health care provider if you should take aspirin to prevent strokes.  Diabetes screening involves taking a blood sample to check your fasting blood sugar level. This should be done once every 3 years, after age 45, if you are within normal weight and without risk factors for diabetes. Testing should be considered at a younger age or be carried out more frequently if you are overweight and have at least 1 risk factor for diabetes.  Breast cancer screening is essential preventive care for women. You should practice "breast self-awareness." This means understanding the normal appearance and feel of your breasts and may include breast self-examination. Any changes detected, no matter how small, should be reported to a health care provider. Women in their 20s and 30s should have a clinical breast exam (CBE) by a health care provider as part of a regular health exam every 1 to 3 years. After age 40, women should have a CBE every year. Starting at age 40, women should consider having a mammogram (breast X-ray test) every year. Women who have a family history of breast cancer should talk to their health care provider about genetic screening. Women at a high risk of breast cancer should talk to their health care providers about having an MRI and a mammogram every year.  Breast cancer gene (BRCA)-related cancer risk assessment is recommended for women who have family members with BRCA-related cancers. BRCA-related cancers include breast, ovarian, tubal, and peritoneal cancers. Having family members with these cancers may be associated with an increased risk for harmful changes (mutations) in the breast cancer genes BRCA1 and BRCA2. Results of the assessment will determine the need for genetic counseling and BRCA1 and BRCA2 testing.  Routine pelvic exams to screen for cancer are no longer recommended for nonpregnant women who are considered low risk for cancer of the pelvic organs (ovaries, uterus, and  vagina) and who do not have symptoms. Ask your health care provider if a screening pelvic exam is right for you.  If you have had past treatment for cervical cancer or a condition that could lead to cancer, you need Pap tests and screening for cancer for at least 20 years after your treatment. If Pap tests have been discontinued, your risk factors (such as having a new sexual partner) need to be   reassessed to determine if screening should be resumed. Some women have medical problems that increase the chance of getting cervical cancer. In these cases, your health care provider may recommend more frequent screening and Pap tests.    Colorectal cancer can be detected and often prevented. Most routine colorectal cancer screening begins at the age of 50 years and continues through age 75 years. However, your health care provider may recommend screening at an earlier age if you have risk factors for colon cancer. On a yearly basis, your health care provider may provide home test kits to check for hidden blood in the stool. Use of a small camera at the end of a tube, to directly examine the colon (sigmoidoscopy or colonoscopy), can detect the earliest forms of colorectal cancer. Talk to your health care provider about this at age 50, when routine screening begins.  Direct exam of the colon should be repeated every 5-10 years through age 75 years, unless early forms of pre-cancerous polyps or small growths are found.  Osteoporosis is a disease in which the bones lose minerals and strength with aging. This can result in serious bone fractures or breaks. The risk of osteoporosis can be identified using a bone density scan. Women ages 65 years and over and women at risk for fractures or osteoporosis should discuss screening with their health care providers. Ask your health care provider whether you should take a calcium supplement or vitamin D to reduce the rate of osteoporosis.  Menopause can be associated with  physical symptoms and risks. Hormone replacement therapy is available to decrease symptoms and risks. You should talk to your health care provider about whether hormone replacement therapy is right for you.  Use sunscreen. Apply sunscreen liberally and repeatedly throughout the day. You should seek shade when your shadow is shorter than you. Protect yourself by wearing long sleeves, pants, a wide-brimmed hat, and sunglasses year round, whenever you are outdoors.  Once a month, do a whole body skin exam, using a mirror to look at the skin on your back. Tell your health care provider of new moles, moles that have irregular borders, moles that are larger than a pencil eraser, or moles that have changed in shape or color.  Stay current with required vaccines (immunizations).  Influenza vaccine. All adults should be immunized every year.  Tetanus, diphtheria, and acellular pertussis (Td, Tdap) vaccine. Pregnant women should receive 1 dose of Tdap vaccine during each pregnancy. The dose should be obtained regardless of the length of time since the last dose. Immunization is preferred during the 27th-36th week of gestation. An adult who has not previously received Tdap or who does not know her vaccine status should receive 1 dose of Tdap. This initial dose should be followed by tetanus and diphtheria toxoids (Td) booster doses every 10 years. Adults with an unknown or incomplete history of completing a 3-dose immunization series with Td-containing vaccines should begin or complete a primary immunization series including a Tdap dose. Adults should receive a Td booster every 10 years.    Zoster vaccine. One dose is recommended for adults aged 60 years or older unless certain conditions are present.    Pneumococcal 13-valent conjugate (PCV13) vaccine. When indicated, a person who is uncertain of her immunization history and has no record of immunization should receive the PCV13 vaccine. An adult aged 19  years or older who has certain medical conditions and has not been previously immunized should receive 1 dose of PCV13 vaccine. This PCV13   should be followed with a dose of pneumococcal polysaccharide (PPSV23) vaccine. The PPSV23 vaccine dose should be obtained at least 1 or more year(s) after the dose of PCV13 vaccine. An adult aged 19 years or older who has certain medical conditions and previously received 1 or more doses of PPSV23 vaccine should receive 1 dose of PCV13. The PCV13 vaccine dose should be obtained 1 or more years after the last PPSV23 vaccine dose.    Pneumococcal polysaccharide (PPSV23) vaccine. When PCV13 is also indicated, PCV13 should be obtained first. All adults aged 65 years and older should be immunized. An adult younger than age 65 years who has certain medical conditions should be immunized. Any person who resides in a nursing home or long-term care facility should be immunized. An adult smoker should be immunized. People with an immunocompromised condition and certain other conditions should receive both PCV13 and PPSV23 vaccines. People with human immunodeficiency virus (HIV) infection should be immunized as soon as possible after diagnosis. Immunization during chemotherapy or radiation therapy should be avoided. Routine use of PPSV23 vaccine is not recommended for American Indians, [place] Natives, or people younger than 65 years unless there are medical conditions that require PPSV23 vaccine. When indicated, people who have unknown immunization and have no record of immunization should receive PPSV23 vaccine. One-time revaccination 5 years after the first dose of PPSV23 is recommended for people aged 19-64 years who have chronic kidney failure, nephrotic syndrome, asplenia, or immunocompromised conditions. People who received 1-2 doses of PPSV23 before age 65 years should receive another dose of PPSV23 vaccine at age 65 years or later if at least 5 years have passed since the  previous dose. Doses of PPSV23 are not needed for people immunized with PPSV23 at or after age 65 years.   Preventive Services / Frequency  Ages 65 years and over  Blood pressure check.  Lipid and cholesterol check.  Lung cancer screening. / Every year if you are aged 55-80 years and have a 30-pack-year history of smoking and currently smoke or have quit within the past 15 years. Yearly screening is stopped once you have quit smoking for at least 15 years or develop a health problem that would prevent you from having lung cancer treatment.  Clinical breast exam.** / Every year after age 40 years.   BRCA-related cancer risk assessment.** / For women who have family members with a BRCA-related cancer (breast, ovarian, tubal, or peritoneal cancers).  Mammogram.** / Every year beginning at age 40 years and continuing for as long as you are in good health. Consult with your health care provider.  Pap test.** / Every 3 years starting at age 30 years through age 65 or 70 years with 3 consecutive normal Pap tests. Testing can be stopped between 65 and 70 years with 3 consecutive normal Pap tests and no abnormal Pap or HPV tests in the past 10 years.  Fecal occult blood test (FOBT) of stool. / Every year beginning at age 50 years and continuing until age 75 years. You may not need to do this test if you get a colonoscopy every 10 years.  Flexible sigmoidoscopy or colonoscopy.** / Every 5 years for a flexible sigmoidoscopy or every 10 years for a colonoscopy beginning at age 50 years and continuing until age 75 years.  Hepatitis C blood test.** / For all people born from 1945 through 1965 and any individual with known risks for hepatitis C.  Osteoporosis screening.** / A one-time screening for women ages 65   years and over and women at risk for fractures or osteoporosis.  Skin self-exam. / Monthly.  Influenza vaccine. / Every year.  Tetanus, diphtheria, and acellular pertussis (Tdap/Td)  vaccine.** / 1 dose of Td every 10 years.  Zoster vaccine.** / 1 dose for adults aged 60 years or older.  Pneumococcal 13-valent conjugate (PCV13) vaccine.** / Consult your health care provider.  Pneumococcal polysaccharide (PPSV23) vaccine.** / 1 dose for all adults aged 65 years and older. Screening for abdominal aortic aneurysm (AAA)  by ultrasound is recommended for people who have history of high blood pressure or who are current or former smokers. ++++++++++++++++++++ Recommend Adult Low Dose Aspirin or  coated  Aspirin 81 mg daily  To reduce risk of Colon Cancer 40 %,  Skin Cancer 26 % ,  Melanoma 46%  and  Pancreatic cancer 60% ++++++++++++++++++++ Vitamin D goal  is between 70-100.  Please make sure that you are taking your Vitamin D as directed.  It is very important as a natural anti-inflammatory  helping hair, skin, and nails, as well as reducing stroke and heart attack risk.  It helps your bones and helps with mood. It also decreases numerous cancer risks so please take it as directed.  Low Vit D is associated with a 200-300% higher risk for CANCER  and 200-300% higher risk for HEART   ATTACK  &  STROKE.   ...................................... It is also associated with higher death rate at younger ages,  autoimmune diseases like Rheumatoid arthritis, Lupus, Multiple Sclerosis.    Also many other serious conditions, like depression, Alzheimer's Dementia, infertility, muscle aches, fatigue, fibromyalgia - just to name a few. ++++++++++++++++++ Recommend the book "The END of DIETING" by Dr Joel Fuhrman  & the book "The END of DIABETES " by Dr Joel Fuhrman At [employer].com - get book & Audio CD's    Being diabetic has a  300% increased risk for heart attack, stroke, cancer, and alzheimer- type vascular dementia. It is very important that you work harder with diet by avoiding all foods that are white. Avoid white rice (brown & wild rice is OK), white potatoes (sweetpotatoes  in moderation is OK), White bread or wheat bread or anything made out of white flour like bagels, donuts, rolls, buns, biscuits, cakes, pastries, cookies, pizza crust, and pasta (made from white flour & egg whites) - vegetarian pasta or spinach or wheat pasta is OK. Multigrain breads like Arnold's or Pepperidge [employer], or multigrain sandwich thins or flatbreads.  Diet, exercise and weight loss can reverse and cure diabetes in the early stages.  Diet, exercise and weight loss is very important in the control and prevention of complications of diabetes which affects every system in your body, ie. Brain - dementia/stroke, eyes - glaucoma/blindness, heart - heart attack/heart failure, kidneys - dialysis, stomach - gastric paralysis, intestines - malabsorption, nerves - severe painful neuritis, circulation - gangrene & loss of a leg(s), and finally cancer and Alzheimers.    I recommend avoid fried & greasy foods,  sweets/candy, white rice (brown or wild rice or Quinoa is OK), white potatoes (sweet potatoes are OK) - anything made from white flour - bagels, doughnuts, rolls, buns, biscuits,white and wheat breads, pizza crust and traditional pasta made of white flour & egg white(vegetarian pasta or spinach or wheat pasta is OK).  Multi-grain bread is OK - like multi-grain flat bread or sandwich thins. Avoid alcohol in excess. Exercise is also important.    Eat all the vegetables you want -   avoid meat, especially red meat and dairy - especially cheese.  Cheese is the most concentrated form of trans-fats which is the worst thing to clog up our arteries. Veggie cheese is OK which can be found in the fresh produce section at [employer] or [employer] or [employer]  +++++++++++++++++++ DASH Eating Plan  DASH stands for "Dietary Approaches to Stop Hypertension."   The DASH eating plan is a healthy eating plan that has been shown to reduce high blood pressure (hypertension). Additional health benefits may include  reducing the risk of type 2 diabetes mellitus, heart disease, and stroke. The DASH eating plan may also help with weight loss. WHAT DO I NEED TO KNOW ABOUT THE DASH EATING PLAN? For the DASH eating plan, you will follow these general guidelines:  Choose foods with a percent daily value for sodium of less than 5% (as listed on the food label).  Use salt-free seasonings or herbs instead of table salt or sea salt.  Check with your health care provider or pharmacist before using salt substitutes.  Eat lower-sodium products, often labeled as "lower sodium" or "no salt added."  Eat fresh foods.  Eat more vegetables, fruits, and low-fat dairy products.  Choose whole grains. Look for the word "whole" as the first word in the ingredient list.  Choose fish   Limit sweets, desserts, sugars, and sugary drinks.  Choose heart-healthy fats.  Eat veggie cheese   Eat more home-cooked food and less restaurant, buffet, and fast food.  Limit fried foods.  Cook foods using methods other than frying.  Limit canned vegetables. If you do use them, rinse them well to decrease the sodium.  When eating at a restaurant, ask that your food be prepared with less salt, or no salt if possible.                      WHAT FOODS CAN I EAT? Read Dr Fara Olden Fuhrman's books on The End of Dieting & The End of Diabetes  Grains Whole grain or whole wheat bread. Brown rice. Whole grain or whole wheat pasta. Quinoa, bulgur, and whole grain cereals. Low-sodium cereals. Corn or whole wheat flour tortillas. Whole grain cornbread. Whole grain crackers. Low-sodium crackers.  Vegetables Fresh or frozen vegetables (raw, steamed, roasted, or grilled). Low-sodium or reduced-sodium tomato and vegetable juices. Low-sodium or reduced-sodium tomato sauce and paste. Low-sodium or reduced-sodium canned vegetables.   Fruits All fresh, canned (in natural juice), or frozen fruits.  Protein Products  All fish and seafood.  Dried  beans, peas, or lentils. Unsalted nuts and seeds. Unsalted canned beans.  Dairy Low-fat dairy products, such as skim or 1% milk, 2% or reduced-fat cheeses, low-fat ricotta or cottage cheese, or plain low-fat yogurt. Low-sodium or reduced-sodium cheeses.  Fats and Oils Tub margarines without trans fats. Light or reduced-fat mayonnaise and salad dressings (reduced sodium). Avocado. Safflower, olive, or canola oils. Natural peanut or almond butter.  Other Unsalted popcorn and pretzels. The items listed above may not be a complete list of recommended foods or beverages. Contact your dietitian for more options.  +++++++++++++++  WHAT FOODS ARE NOT RECOMMENDED? Grains/ White flour or wheat flour White bread. White pasta. White rice. Refined cornbread. Bagels and croissants. Crackers that contain trans fat.  Vegetables  Creamed or fried vegetables. Vegetables in a . Regular canned vegetables. Regular canned tomato sauce and paste. Regular tomato and vegetable juices.  Fruits Dried fruits. Canned fruit in light or heavy syrup. Fruit juice.  Meat and Other Protein Products Meat in general - RED meat & White meat.  Fatty cuts of meat. Ribs, chicken wings, all processed meats as bacon, sausage, bologna, salami, fatback, hot dogs, bratwurst and packaged luncheon meats.  Dairy Whole or 2% milk, cream, half-and-half, and cream cheese. Whole-fat or sweetened yogurt. Full-fat cheeses or blue cheese. Non-dairy creamers and whipped toppings. Processed cheese, cheese spreads, or cheese curds.  Condiments Onion and garlic salt, seasoned salt, table salt, and sea salt. Canned and packaged gravies. Worcestershire sauce. Tartar sauce. Barbecue sauce. Teriyaki sauce. Soy sauce, including reduced sodium. Steak sauce. Fish sauce. Oyster sauce. Cocktail sauce. Horseradish. Ketchup and mustard. Meat flavorings and tenderizers. Bouillon cubes. Hot sauce. Tabasco sauce. Marinades. Taco seasonings.  Relishes.  Fats and Oils Butter, stick margarine, lard, shortening and bacon fat. Coconut, palm kernel, or palm oils. Regular salad dressings.  Pickles and olives. Salted popcorn and pretzels.  The items listed above may not be a complete list of foods and beverages to avoid.    

## 2020-01-18 NOTE — Progress Notes (Signed)
Comprehensive Evaluation &  Examination     This very nice 69 y.o. MWF  presents for a comprehensive evaluation and management of multiple medical co-morbidities.  Patient has been followed for HTN, HLD, Prediabetes  and Vitamin D Deficiency.      Patient has been followed expectantly for mild labile HTN occurring after a CVA in Sept 2019.  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 Patient's BP has been controlled at home and patient denies any cardiac symptoms as chest pain, palpitations, shortness of breath, dizziness or ankle swelling. Today's BP is at goal - 116/78.      Patient's hyperlipidemia is controlled with diet and medications. Patient denies myalgias or other medication SE's. Last lipids were at goal:  Lab Results  Component Value Date   CHOL 163 06/14/2019   HDL 64 06/14/2019   LDLCALC 78 06/14/2019   TRIG 119 06/14/2019   CHOLHDL 2.5 06/14/2019       Patient is monitored expectantly for glucose intolerance  and patient denies reactive hypoglycemic symptoms, visual blurring, diabetic polys or paresthesias. Last A1c was Normal & at goal:   Lab Results  Component Value Date   HGBA1C 5.4 01/18/2019       Finally, patient has history of Vitamin D Deficiency ("35" / 2019) and last Vitamin D even  lower:  Lab Results  Component Value Date   VD25OH 68 (L) 01/18/2019    Current Outpatient Medications on File Prior to Visit  Medication Sig  . aspirin EC 81 MG EC tablet Take 1 tablet (81 mg total) by mouth daily.  Marland Kitchen atorvastatin (LIPITOR) 80 MG tablet Take 1 tablet Daily for Cholesterol   No current facility-administered medications on file prior to visit.   Allergies  Allergen Reactions  . Codeine Other (See Comments)    Nausea , Dizziness   Past Medical History:  Diagnosis Date  . Stroke [facility])    Health Maintenance  Topic Date Due  . INFLUENZA VACCINE  07/24/2019  . TETANUS/TDAP  06/13/2020 (Originally  03/03/1970)  . Hepatitis C Screening  06/13/2020 (Originally 07/26/1951)  . MAMMOGRAM  Discontinued  . DEXA SCAN  Discontinued  . COLONOSCOPY  Discontinued  . PNA vac Low Risk Adult  Discontinued    There is no immunization history on file for this patient.   Last Colon - Relates had "years ago" and refuses to schedule - seems agreeable to do Cologard, so will order Cologard  Last MGM - Refuses to schedule   Past Surgical History:  Procedure Laterality Date  . cataract surgery    . cataracts     Family History  Problem Relation Age of Onset  . Transient ischemic attack Mother   . COPD Mother   . Heart attack Mother 64       triple bypass  . Stroke Maternal Grandfather   . Hyperlipidemia Maternal Grandfather        triglycerides   Social History   Tobacco Use  . Smoking status: Never Smoker  . Smokeless tobacco: Never Used  Substance Use Topics  . Alcohol use: Yes    Comment: occasional wine  . Drug use: Never    ROS Constitutional: Denies fever, chills, weight loss/gain, headaches, insomnia,  night sweats, and change in appetite. Does c/o fatigue. Eyes: Denies redness, blurred vision, diplopia, discharge, itchy, watery eyes.  ENT: Denies discharge, congestion, post nasal drip, epistaxis, sore throat, earache, hearing loss,  dental pain, Tinnitus, Vertigo, Sinus pain, snoring.  Cardio: Denies chest pain, palpitations, irregular heartbeat, syncope, dyspnea, diaphoresis, orthopnea, PND, claudication, edema Respiratory: denies cough, dyspnea, DOE, pleurisy, hoarseness, laryngitis, wheezing.  Gastrointestinal: Denies dysphagia, heartburn, reflux, water brash, pain, cramps, nausea, vomiting, bloating, diarrhea, constipation, hematemesis, melena, hematochezia, jaundice, hemorrhoids Genitourinary: Denies dysuria, frequency, urgency, nocturia, hesitancy, discharge, hematuria, flank pain Breast: Breast lumps, nipple discharge, bleeding.  Musculoskeletal: Denies arthralgia, myalgia,  stiffness, Jt. Swelling, pain, limp, and strain/sprain. Denies falls. Skin: Denies puritis, rash, hives, warts, acne, eczema, changing in skin lesion Neuro: No weakness, tremor, incoordination, spasms, paresthesia, pain Psychiatric: Denies confusion, memory loss, sensory loss. Denies Depression. Endocrine: Denies change in weight, skin, hair change, nocturia, and paresthesia, diabetic polys, visual blurring, hyper / hypo glycemic episodes.  Heme/Lymph: No excessive bleeding, bruising, enlarged lymph nodes.  Physical Exam  BP 116/78   Pulse 64   Temp (!) 97.2 F (36.2 C)   Resp 16   Ht 5\' 5"  (1.651 m)   Wt 140 lb 9.6 oz (63.8 kg)   BMI 23.40 kg/m   General Appearance: Well nourished, well groomed and in no apparent distress.  Eyes: PERRLA, EOMs, conjunctiva no swelling or erythema, normal fundi and vessels. Sinuses: No frontal/maxillary tenderness ENT/Mouth: EACs patent / TMs  nl. Nares clear without erythema, swelling, mucoid exudates. Oral hygiene is good. No erythema, swelling, or exudate. Tongue normal, non-obstructing. Tonsils not swollen or erythematous. Hearing normal.  Neck: Supple, thyroid not palpable. No bruits, nodes or JVD. Respiratory: Respiratory effort normal.  BS equal and clear bilateral without rales, rhonci, wheezing or stridor. Cardio: Heart sounds are normal with regular rate and rhythm and no murmurs, rubs or gallops. Peripheral pulses are normal and equal bilaterally without edema. No aortic or femoral bruits. Chest: symmetric with normal excursions and percussion. Breasts: Symmetric, without lumps, nipple discharge, retractions, or fibrocystic changes.  Abdomen: Flat, soft with bowel sounds active. Nontender, no guarding, rebound, hernias, masses, or organomegaly.  Lymphatics: Non tender without lymphadenopathy.  Genitourinary:  Musculoskeletal: Full ROM all peripheral extremities, joint stability, 5/5 strength, and normal gait. Skin: Warm and dry without  rashes, lesions, cyanosis, clubbing or  ecchymosis.  Neuro: Cranial nerves intact, reflexes equal bilaterally. Normal muscle tone, no cerebellar symptoms. Sensation intact.  Pysch: Alert and oriented X 3, normal affect, Insight and Judgment appropriate.   Assessment and Plan 1. Elevated BP without diagnosis of hypertension  - EKG 12-Lead - Urinalysis, Routine w reflex microscopic - Microalbumin / creatinine urine ratio - CBC with Differential/Platelet - COMPLETE METABOLIC PANEL WITH GFR - Magnesium - TSH  2. Hyperlipidemia, mixed  - EKG 12-Lead - Lipid panel - TSH  3. Abnormal glucose  - EKG 12-Lead - Hemoglobin A1c - Insulin, random  4. Vitamin D deficiency  - VITAMIN D 25 Hydroxy  5. History of CVA (cerebrovascular accident) without residual deficits   6. Screening for colorectal cancer  - Cologard  7. Screening for ischemic heart disease  - EKG 12-Lead  8. FHx: heart disease  - EKG 12-Lead  9. Medication management  - Urinalysis, Routine w reflex microscopic - Microalbumin / creatinine urine ratio - CBC with Differential/Platelet - COMPLETE METABOLIC PANEL WITH GFR - Magnesium - Lipid panel - TSH - Hemoglobin A1c - Insulin, random - VITAMIN D 25 Hydroxy       Patient was counseled in prudent diet to achieve/maintain BMI less than 25 for weight control, BP monitoring, regular exercise and medications. Discussed med's effects and SE's. Screening labs  and tests as requested with regular follow-up as recommended. Over 40 minutes of exam, counseling, chart review and high complex critical decision making was performed.   Kirtland Bouchard, MD

## 2020-01-19 ENCOUNTER — Other Ambulatory Visit: Payer: Self-pay

## 2020-01-19 ENCOUNTER — Ambulatory Visit (INDEPENDENT_AMBULATORY_CARE_PROVIDER_SITE_OTHER): Payer: Medicare Other | Admitting: Internal Medicine

## 2020-01-19 VITALS — BP 116/78 | HR 64 | Temp 97.2°F | Resp 16 | Ht 65.0 in | Wt 140.6 lb

## 2020-01-19 DIAGNOSIS — Z79899 Other long term (current) drug therapy: Secondary | ICD-10-CM

## 2020-01-19 DIAGNOSIS — I10 Essential (primary) hypertension: Secondary | ICD-10-CM

## 2020-01-19 DIAGNOSIS — Z8249 Family history of ischemic heart disease and other diseases of the circulatory system: Secondary | ICD-10-CM | POA: Diagnosis not present

## 2020-01-19 DIAGNOSIS — E559 Vitamin D deficiency, unspecified: Secondary | ICD-10-CM

## 2020-01-19 DIAGNOSIS — Z1212 Encounter for screening for malignant neoplasm of rectum: Secondary | ICD-10-CM

## 2020-01-19 DIAGNOSIS — R030 Elevated blood-pressure reading, without diagnosis of hypertension: Secondary | ICD-10-CM

## 2020-01-19 DIAGNOSIS — Z8673 Personal history of transient ischemic attack (TIA), and cerebral infarction without residual deficits: Secondary | ICD-10-CM

## 2020-01-19 DIAGNOSIS — Z136 Encounter for screening for cardiovascular disorders: Secondary | ICD-10-CM

## 2020-01-19 DIAGNOSIS — E782 Mixed hyperlipidemia: Secondary | ICD-10-CM | POA: Diagnosis not present

## 2020-01-19 DIAGNOSIS — R7309 Other abnormal glucose: Secondary | ICD-10-CM | POA: Diagnosis not present

## 2020-01-19 DIAGNOSIS — Z1211 Encounter for screening for malignant neoplasm of colon: Secondary | ICD-10-CM

## 2020-01-20 LAB — CBC WITH DIFFERENTIAL/PLATELET
Absolute Monocytes: 383 cells/uL (ref 200–950)
Basophils Absolute: 11 cells/uL (ref 0–200)
Basophils Relative: 0.2 %
Eosinophils Absolute: 70 cells/uL (ref 15–500)
Eosinophils Relative: 1.3 %
HCT: 40.1 % (ref 35.0–45.0)
Hemoglobin: 13.6 g/dL (ref 11.7–15.5)
Lymphs Abs: 1993 cells/uL (ref 850–3900)
MCH: 31.1 pg (ref 27.0–33.0)
MCHC: 33.9 g/dL (ref 32.0–36.0)
MCV: 91.8 fL (ref 80.0–100.0)
MPV: 9.5 fL (ref 7.5–12.5)
Monocytes Relative: 7.1 %
Neutro Abs: 2943 cells/uL (ref 1500–7800)
Neutrophils Relative %: 54.5 %
Platelets: 275 10*3/uL (ref 140–400)
RBC: 4.37 10*6/uL (ref 3.80–5.10)
RDW: 11.8 % (ref 11.0–15.0)
Total Lymphocyte: 36.9 %
WBC: 5.4 10*3/uL (ref 3.8–10.8)

## 2020-01-20 LAB — URINALYSIS, ROUTINE W REFLEX MICROSCOPIC
Bilirubin Urine: NEGATIVE
Glucose, UA: NEGATIVE
Hgb urine dipstick: NEGATIVE
Ketones, ur: NEGATIVE
Leukocytes,Ua: NEGATIVE
Nitrite: NEGATIVE
Protein, ur: NEGATIVE
Specific Gravity, Urine: 1.005 (ref 1.001–1.03)
pH: >=8.5 — AB (ref 5.0–8.0)

## 2020-01-20 LAB — COMPLETE METABOLIC PANEL WITH GFR
AG Ratio: 1.7 (calc) (ref 1.0–2.5)
ALT: 26 U/L (ref 6–29)
AST: 28 U/L (ref 10–35)
Albumin: 4.2 g/dL (ref 3.6–5.1)
Alkaline phosphatase (APISO): 78 U/L (ref 37–153)
BUN: 14 mg/dL (ref 7–25)
CO2: 34 mmol/L — ABNORMAL HIGH (ref 20–32)
Calcium: 10 mg/dL (ref 8.6–10.4)
Chloride: 99 mmol/L (ref 98–110)
Creat: 0.88 mg/dL (ref 0.50–0.99)
GFR, Est African American: 78 mL/min/{1.73_m2} (ref >=60)
GFR, Est Non African American: 67 mL/min/{1.73_m2} (ref >=60)
Globulin: 2.5 g/dL (calc) (ref 1.9–3.7)
Glucose, Bld: 96 mg/dL (ref 65–99)
Potassium: 5.2 mmol/L (ref 3.5–5.3)
Sodium: 138 mmol/L (ref 135–146)
Total Bilirubin: 0.4 mg/dL (ref 0.2–1.2)
Total Protein: 6.7 g/dL (ref 6.1–8.1)

## 2020-01-20 LAB — LIPID PANEL
Cholesterol: 144 mg/dL (ref <200)
HDL: 59 mg/dL (ref >=50)
LDL Cholesterol (Calc): 66 mg/dL (calc)
Non-HDL Cholesterol (Calc): 85 mg/dL (calc) (ref <130)
Total CHOL/HDL Ratio: 2.4 (calc) (ref <5.0)
Triglycerides: 107 mg/dL (ref <150)

## 2020-01-20 LAB — HEMOGLOBIN A1C
Hgb A1c MFr Bld: 5.5 % of total Hgb (ref <5.7)
Mean Plasma Glucose: 111 (calc)
eAG (mmol/L): 6.2 (calc)

## 2020-01-20 LAB — MAGNESIUM: Magnesium: 2.3 mg/dL (ref 1.5–2.5)

## 2020-01-20 LAB — MICROALBUMIN / CREATININE URINE RATIO
Creatinine, Urine: 14 mg/dL — ABNORMAL LOW (ref 20–275)
Microalb Creat Ratio: 14 mcg/mg creat (ref <30)
Microalb, Ur: 0.2 mg/dL

## 2020-01-20 LAB — TSH: TSH: 2.17 mIU/L (ref 0.40–4.50)

## 2020-01-20 LAB — INSULIN, RANDOM: Insulin: 7.9 u[IU]/mL

## 2020-01-20 LAB — VITAMIN D 25 HYDROXY (VIT D DEFICIENCY, FRACTURES): Vit D, 25-Hydroxy: 52 ng/mL (ref 30–100)

## 2020-03-07 ENCOUNTER — Telehealth: Payer: Self-pay | Admitting: *Deleted

## 2020-03-07 NOTE — Telephone Encounter (Signed)
Left message to remind patient to return Cologuard kit.

## 2020-03-15 ENCOUNTER — Other Ambulatory Visit: Payer: Self-pay | Admitting: Internal Medicine

## 2020-03-20 IMAGING — CT CT ANGIO HEAD
1 of 11 series · 5 of 33 positions shown · IV contrast (APPLIED)
Comparison: CT head 08/27/2018

CLINICAL DATA: Left-sided weakness.  Slurred speech.

EXAM:
CT ANGIOGRAPHY HEAD AND NECK
TECHNIQUE: Multidetector CT imaging of the head and neck was performed using
the standard protocol during bolus administration of intravenous
contrast. Multiplanar CT image reconstructions and MIPs were
obtained to evaluate the vascular anatomy. Carotid stenosis
measurements (when applicable) are obtained utilizing NASCET
criteria, using the distal internal carotid diameter as the
denominator.
CONTRAST:  100mL LN1T8I-AYL IOPAMIDOL (LN1T8I-AYL) INJECTION 76%

[Series 7: axial thin · axial · 0.45mm/px · z∈[-313,-72]mm · 5 of 363 slices shown]
[im 61/363  soft-tissue]
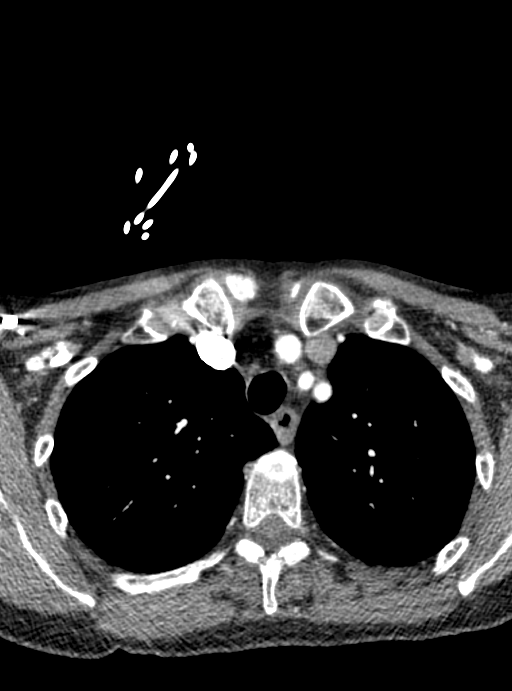
[im 121/363  bone]
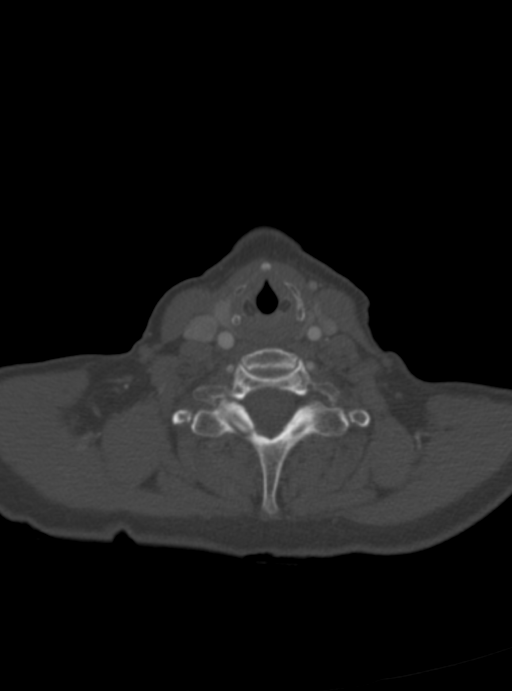
[im 182/363  soft-tissue]
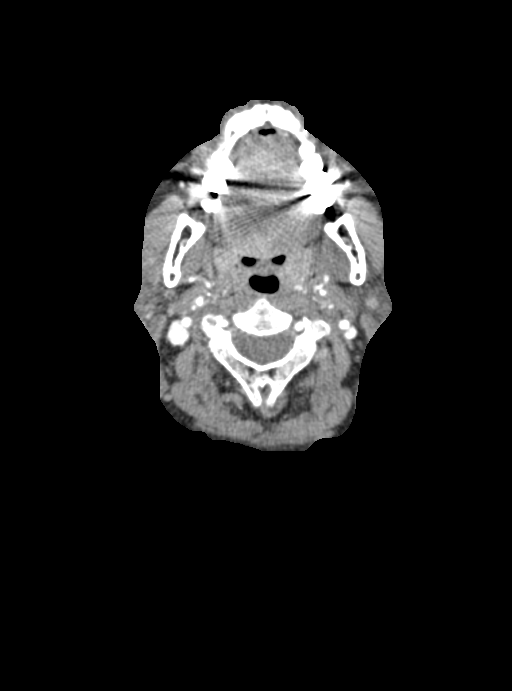
[im 242/363  bone]
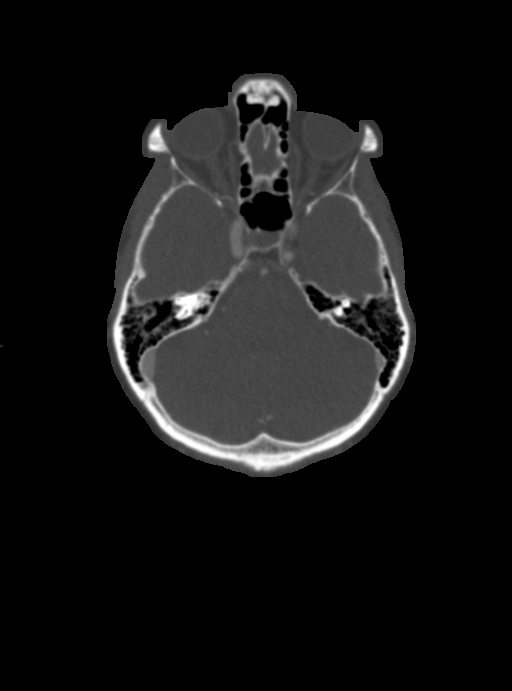
[im 302/363  soft-tissue]
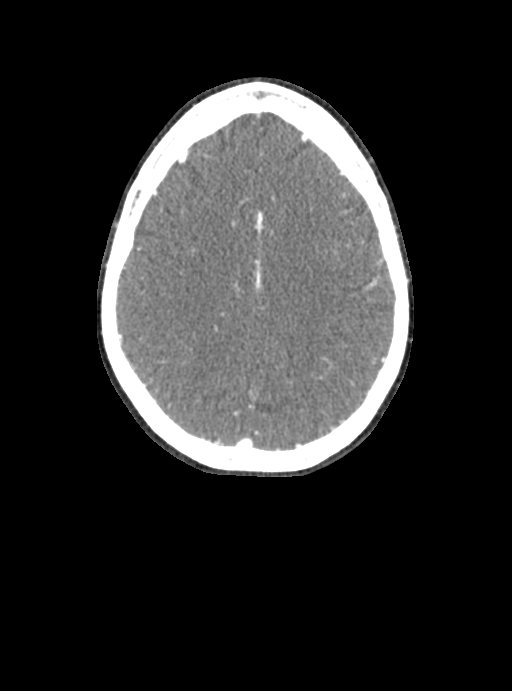

[5 of 33 positions shown; findings below may reference images not displayed]

FINDINGS: CTA NECK FINDINGS

Aortic arch: Standard branching. Imaged portion shows no evidence of
aneurysm or dissection. No significant stenosis of the major arch
vessel origins.

Right carotid system: Right common carotid artery widely patent.
Mild atherosclerotic plaque right carotid bifurcation without
stenosis. Beaded irregularity of the right internal carotid artery
at the C2 level compatible with fibromuscular dysplasia. Negative
for dissection or stenosis.

Left carotid system: Normal left carotid without stenosis or
fibromuscular dysplasia.

Vertebral arteries: Both vertebral arteries widely patent without
stenosis

Skeleton: Mild degenerative changes in the cervical spine. No acute
skeletal abnormality.

Other neck: Negative

Upper chest: 4 mm nodule right upper lobe. Remaining upper lobes are
clear. No effusion.

Review of the MIP images confirms the above findings

CTA HEAD FINDINGS

Anterior circulation: Cavernous carotid normal bilaterally without
stenosis. Anterior and middle cerebral arteries normal bilaterally
without stenosis or occlusion.

Posterior circulation: Both vertebral arteries patent to the
basilar. PICA patent bilaterally. AICA patent. Superior cerebellar
and posterior cerebral arteries patent bilaterally. Basilar widely
patent. Fetal origin of the right posterior cerebral artery.

Venous sinuses: Patent

Anatomic variants: None

Delayed phase: Normal enhancement on delayed imaging. Mild chronic
microvascular ischemic changes.

Review of the MIP images confirms the above findings
IMPRESSION: 1. No significant carotid or vertebral artery stenosis in the neck.
Mild atherosclerotic disease right carotid bifurcation.
Fibromuscular dysplasia right internal carotid artery without
stenosis or dissection
2. Negative for emergent large vessel occlusion. No significant
intracranial stenosis.
3. 4 mm right upper lobe nodule No follow-up needed if patient is
low-risk. Non-contrast chest CT can be considered in 12 months if
patient is high-risk. This recommendation follows the consensus
statement: Guidelines for Management of Incidental Pulmonary Nodules
Detected on CT Images: From the [HOSPITAL] 8528; Radiology

## 2020-04-19 ENCOUNTER — Other Ambulatory Visit: Payer: Self-pay

## 2020-04-19 ENCOUNTER — Encounter: Payer: Self-pay | Admitting: Adult Health

## 2020-04-19 ENCOUNTER — Ambulatory Visit (INDEPENDENT_AMBULATORY_CARE_PROVIDER_SITE_OTHER): Payer: Medicare Other | Admitting: Adult Health

## 2020-04-19 VITALS — BP 142/82 | HR 58 | Temp 97.3°F | Ht 65.0 in | Wt 139.0 lb

## 2020-04-19 DIAGNOSIS — E559 Vitamin D deficiency, unspecified: Secondary | ICD-10-CM

## 2020-04-19 DIAGNOSIS — E782 Mixed hyperlipidemia: Secondary | ICD-10-CM

## 2020-04-19 DIAGNOSIS — Z6823 Body mass index (BMI) 23.0-23.9, adult: Secondary | ICD-10-CM | POA: Diagnosis not present

## 2020-04-19 DIAGNOSIS — Z8673 Personal history of transient ischemic attack (TIA), and cerebral infarction without residual deficits: Secondary | ICD-10-CM

## 2020-04-19 DIAGNOSIS — I10 Essential (primary) hypertension: Secondary | ICD-10-CM | POA: Diagnosis not present

## 2020-04-19 LAB — CBC WITH DIFFERENTIAL/PLATELET
Absolute Monocytes: 437 cells/uL (ref 200–950)
Basophils Absolute: 18 cells/uL (ref 0–200)
Basophils Relative: 0.3 %
Eosinophils Absolute: 118 cells/uL (ref 15–500)
Eosinophils Relative: 2 %
HCT: 40.3 % (ref 35.0–45.0)
Hemoglobin: 13.5 g/dL (ref 11.7–15.5)
Lymphs Abs: 2803 cells/uL (ref 850–3900)
MCH: 30.8 pg (ref 27.0–33.0)
MCHC: 33.5 g/dL (ref 32.0–36.0)
MCV: 91.8 fL (ref 80.0–100.0)
MPV: 9.7 fL (ref 7.5–12.5)
Monocytes Relative: 7.4 %
Neutro Abs: 2525 cells/uL (ref 1500–7800)
Neutrophils Relative %: 42.8 %
Platelets: 276 10*3/uL (ref 140–400)
RBC: 4.39 10*6/uL (ref 3.80–5.10)
RDW: 12.5 % (ref 11.0–15.0)
Total Lymphocyte: 47.5 %
WBC: 5.9 10*3/uL (ref 3.8–10.8)

## 2020-04-19 LAB — TSH: TSH: 1.83 mIU/L (ref 0.40–4.50)

## 2020-04-19 LAB — COMPLETE METABOLIC PANEL WITH GFR
AG Ratio: 1.7 (calc) (ref 1.0–2.5)
ALT: 22 U/L (ref 6–29)
AST: 25 U/L (ref 10–35)
Albumin: 4.3 g/dL (ref 3.6–5.1)
Alkaline phosphatase (APISO): 79 U/L (ref 37–153)
BUN: 17 mg/dL (ref 7–25)
CO2: 31 mmol/L (ref 20–32)
Calcium: 9.7 mg/dL (ref 8.6–10.4)
Chloride: 100 mmol/L (ref 98–110)
Creat: 0.84 mg/dL (ref 0.60–0.93)
GFR, Est African American: 82 mL/min/{1.73_m2} (ref >=60)
GFR, Est Non African American: 70 mL/min/{1.73_m2} (ref >=60)
Globulin: 2.6 g/dL (calc) (ref 1.9–3.7)
Glucose, Bld: 93 mg/dL (ref 65–99)
Potassium: 4.8 mmol/L (ref 3.5–5.3)
Sodium: 137 mmol/L (ref 135–146)
Total Bilirubin: 0.5 mg/dL (ref 0.2–1.2)
Total Protein: 6.9 g/dL (ref 6.1–8.1)

## 2020-04-19 LAB — LIPID PANEL
Cholesterol: 146 mg/dL (ref <200)
HDL: 57 mg/dL (ref >=50)
LDL Cholesterol (Calc): 66 mg/dL (calc)
Non-HDL Cholesterol (Calc): 89 mg/dL (calc) (ref <130)
Total CHOL/HDL Ratio: 2.6 (calc) (ref <5.0)
Triglycerides: 145 mg/dL (ref <150)

## 2020-04-19 MED ORDER — ATORVASTATIN CALCIUM 80 MG PO TABS: ORAL_TABLET | ORAL | 1 refills | Status: DC

## 2020-04-19 NOTE — Patient Instructions (Addendum)
Goals    . Blood Pressure < 130/80    . LDL CALC < 100        A great goal to work towards is aiming to get in a serving daily of some of the most nutritionally dense foods - G- BOMBS daily   Also aim to get at least a serving or two of whole grains daily - old fashioned oats, quinoa, farro, brown rice, barley, etc - great for reducing inflammation, gut health, reduces cardiovascular risk (heart attack, stroke, etc)   Please check blood pressure occasionally at home and keep a log to bring to each visit -

## 2020-04-19 NOTE — Progress Notes (Signed)
Assessment and Plan:  History of CVA (cerebrovascular accident) without residual deficits Continue ASA, statin Control blood pressure, cholesterol, glucose, increase exercise.   Essential labile hypertension Typically very well controlled off of medications at this time; atypically elevated today, borderline, attributes to stressful meeting, plan to recheck next visit prior to any med adjustment Monitor blood pressure at home; call if consistently over 130/80 Continue DASH diet.   Reminder to go to the ER if any CP, SOB, nausea, dizziness, severe HA, changes vision/speech, left arm numbness and tingling and jaw pain.  Hyperlipidemia, mixed Continue medications: atorvastatin Continue low cholesterol diet and exercise.  Check lipid panel, TSH  Vitamin D deficiency Near goal at recent check; continue to recommend supplementation for goal of 60-100 Defer vitamin D level  BMI 23.0-23.9, adult Continue to recommend diet heavy in fruits and veggies and low in animal meats, cheeses, and dairy products, appropriate calorie intake Discuss exercise recommendations routinely Continue to monitor weight at each visit  Medication management CBC, CMP/GFR   Further disposition pending results of labs. Discussed med's effects and SE's.   Over 30 minutes of exam, counseling, chart review, and critical decision making was performed.   Future Appointments  Date Time Provider [place]  07/24/2020  4:30 PM Unk Pinto, MD [facility] None  01/18/2021 11:00 AM Unk Pinto, MD [facility] None    ------------------------------------------------------------------------------------------------------------------   HPI 69 y.o.female presents for 3 month follow up for hypertention, hyperlipidemia, vitamin D.   She is still working, works in [employer]. Stressful meeting today -   She is new to our office in 2019 after experiencing a right basal ganglia  infarct in 08/2018 secondary to small vessel disease following not presenting for medical screening or management for several decades.  She continues to do well from a stroke standpoint without residual deficits or reoccurring symptoms.  She continues on aspirin 81 mg and atorvastatin.   BMI is Body mass index is 23.13 kg/m., she has been working on diet and exercise, walking, step aerobics, 40 min twice daily. Diet - no junk foods, tons of fruits/veggies, chicken/fish.  Wt Readings from Last 3 Encounters:  04/19/20 139 lb (63 kg)  01/19/20 140 lb 9.6 oz (63.8 kg)  06/14/19 142 lb (64.4 kg)   She does not check BP at home, reports always good in AM, worse in the afternoon after work, had stressful meeting today, today their BP is BP: (!) 142/82 Similar on recheck.   She does workout. She denies chest pain, shortness of breath, dizziness.   She is on cholesterol medication (atorvastatin 80 mg every other day) and denies myalgias. Her cholesterol is at goal. The cholesterol last visit was:   Lab Results  Component Value Date   CHOL 144 01/19/2020   HDL 59 01/19/2020   LDLCALC 66 01/19/2020   TRIG 107 01/19/2020   CHOLHDL 2.4 01/19/2020    She has been working on diet and exercise for glucose management, and denies increased appetite, nausea, paresthesia of the feet, polydipsia, polyuria, visual disturbances, vomiting and weight loss. Last A1C in the office was:  Lab Results  Component Value Date   HGBA1C 5.5 01/19/2020    Last GFR:  Lab Results  Component Value Date   GFRNONAA 67 01/19/2020   Patient is on Vitamin D supplement.   Lab Results  Component Value Date   VD25OH 52 01/19/2020        Past Medical History:  Diagnosis Date  . Stroke [facility])  Allergies  Allergen Reactions  . Codeine Other (See Comments)    Nausea , Dizziness    Current Outpatient Medications on File Prior to Visit  Medication Sig  . aspirin EC 81 MG EC tablet Take 1 tablet (81 mg total) by  mouth daily.  Marland Kitchen atorvastatin (LIPITOR) 80 MG tablet Take 1 tablet Daily for Cholesterol  . cholecalciferol (VITAMIN D3) 25 MCG (1000 UNIT) tablet Take 1,000 Units by mouth daily.   No current facility-administered medications on file prior to visit.    ROS: Review of Systems  Constitutional: Negative for malaise/fatigue and weight loss.  HENT: Negative for hearing loss and tinnitus.   Eyes: Negative for blurred vision and double vision.  Respiratory: Negative for cough, shortness of breath and wheezing.   Cardiovascular: Negative for chest pain, palpitations, orthopnea, claudication and leg swelling.  Gastrointestinal: Negative for abdominal pain, blood in stool, constipation, diarrhea, heartburn, melena, nausea and vomiting.  Genitourinary: Negative.   Musculoskeletal: Negative for joint pain and myalgias.  Skin: Negative for rash.  Neurological: Negative for dizziness, tingling, sensory change, weakness and headaches.  Endo/Heme/Allergies: Negative for polydipsia.  Psychiatric/Behavioral: Negative.   All other systems reviewed and are negative.   Physical Exam:  BP (!) 142/82   Pulse (!) 58   Temp (!) 97.3 F (36.3 C)   Ht 5\' 5"  (1.651 m)   Wt 139 lb (63 kg)   SpO2 99%   BMI 23.13 kg/m   General Appearance: Well nourished, in no apparent distress. Eyes: PERRLA, EOMs, conjunctiva no swelling or erythema Sinuses: No Frontal/maxillary tenderness ENT/Mouth: Ext aud canals clear, TMs without erythema, bulging. No erythema, swelling, or exudate on post pharynx.  Tonsils not swollen or erythematous. Hearing normal.  Neck: Supple, thyroid normal.  Respiratory: Respiratory effort normal, BS equal bilaterally without rales, rhonchi, wheezing or stridor.  Cardio: RRR with no MRGs. Brisk peripheral pulses without edema.  Abdomen: Soft, + BS.  Non tender, no guarding, rebound, hernias, masses. Lymphatics: Non tender without lymphadenopathy.  Musculoskeletal: Full ROM, 5/5 strength,  normal gait.  Skin: Warm, dry without rashes, lesions, ecchymosis.  Neuro: Cranial nerves intact. Normal muscle tone, no cerebellar symptoms. Sensation intact.  Psych: Awake and oriented X 3, normal affect, Insight and Judgment appropriate.     , NP 4:42 PM [facility]

## 2020-06-29 ENCOUNTER — Telehealth: Payer: Self-pay | Admitting: *Deleted

## 2020-06-29 NOTE — Telephone Encounter (Signed)
Advised patient to return Cologuard kit. 

## 2020-07-24 ENCOUNTER — Ambulatory Visit: Payer: Medicare Other | Admitting: Internal Medicine

## 2020-07-24 NOTE — Progress Notes (Signed)
MEDICARE ANNUAL WELLNESS VISIT AND FOLLOW UP  Assessment:    Melinda Burns was seen today for follow-up and medicare wellness.  Diagnoses and all orders for this visit:  Encounter for Medicare annual wellness exam Declines colon cancer screening, breast cancer screening - would not pursue treatment patient declines a colonoscopy even though the risks and benefits were discussed at length. Colon cancer is 3rd most diagnosed cancer and 2nd leading cause of death in both men and women 16 years of age and older. Patient understands the risk of cancer and death with declining the test, declines cologuard or hemoccult as well.  She declines all vaccines today; she would obtain tetanus booster if needed for dirty wound.   History of CVA (cerebrovascular accident) without residual deficits Continue ASA, statin Control blood pressure, cholesterol, glucose, increase exercise.   Essential hypertension At goal off of medications at this time; monitor Monitor blood pressure at home; call if consistently over 130/80 Continue DASH diet.   Reminder to go to the ER if any CP, SOB, nausea, dizziness, severe HA, changes vision/speech, left arm numbness and tingling and jaw pain.  Hyperlipidemia, mixed Continue medications: atorvastatin LDL goal <70 Continue low cholesterol diet and exercise.  Check lipid panel.   Vitamin D deficiency Currently on supplement and esentially at goal Aim for 60-100; recheck at CPE  BMI 23.0-23.9, adult Continue to recommend diet heavy in fruits and veggies and low in animal meats, cheeses, and dairy products, appropriate calorie intake Discuss exercise recommendations routinely Continue to monitor weight at each visit  Medication management CBC, CMP/GFR  Over 40 minutes of exam, counseling, chart review and critical decision making was performed Future Appointments  Date Time Provider Department Center  01/18/2021 11:00 AM Lucky Cowboy, MD [facility] None   08/08/2021  3:00 PM Judd Gaudier, NP [facility] None     Plan:   During the course of the visit the patient was educated and counseled about appropriate screening and preventive services including:    Pneumococcal vaccine   Prevnar 13  Influenza vaccine  Td vaccine  Screening electrocardiogram  Bone densitometry screening  Colorectal cancer screening  Diabetes screening  Glaucoma screening  Nutrition counseling   Advanced directives: requested   Subjective:  Melinda Burns is a 69 y.o. female who presents for Medicare Annual Wellness Visit and 3 month follow up.   She is still working, works in Psychologist, occupational. Retired Freight forwarder.   She is new to our office this in 2019 after experiencing a right basal ganglia infarct in 08/2018 secondary to small vessel disease following not presenting for medical screening or management for several decades.  She has recovered without sequela and has been released by neurology. Continues with aspirin 81 mg and atorvastatin.   BMI is Body mass index is 23.3 kg/m., she has been working on diet and exercise, exercises 30-40 min twice daily, avoids fast food, fried food, <6 ounce of red meat weekly, fish and chicken, low fat daily, mainly fresh fruits and vegetables.  Wt Readings from Last 3 Encounters:  07/26/20 140 lb (63.5 kg)  04/19/20 139 lb (63 kg)  01/19/20 140 lb 9.6 oz (63.8 kg)   Bps have typically been at goal; today their BP is BP: 128/80 She does workout. She denies chest pain, shortness of breath, dizziness.   She is on cholesterol medication (atorvastatin 80 mg every other day) and denies myalgias. Her cholesterol is at goal. The cholesterol last visit was:   Lab Results  Component  Value Date   CHOL 146 04/19/2020   HDL 57 04/19/2020   LDLCALC 66 04/19/2020   TRIG 145 04/19/2020   CHOLHDL 2.6 04/19/2020    She has been working on diet and exercise for glucose management. Last A1C in the office was:   Lab Results  Component Value Date   HGBA1C 5.5 01/19/2020   Last GFR: Lab Results  Component Value Date   GFRNONAA 70 04/19/2020   Patient is on Vitamin D supplement.   Lab Results  Component Value Date   VD25OH 52 01/19/2020      Medication Review: Current Outpatient Medications on File Prior to Visit  Medication Sig Dispense Refill  . aspirin EC 81 MG EC tablet Take 1 tablet (81 mg total) by mouth daily. 30 tablet 1  . atorvastatin (LIPITOR) 80 MG tablet Take 1 tablet every other day for Cholesterol 90 tablet 1  . cholecalciferol (VITAMIN D3) 25 MCG (1000 UNIT) tablet Take 1,000 Units by mouth daily.     No current facility-administered medications on file prior to visit.    Allergies  Allergen Reactions  . Codeine Other (See Comments)    Nausea , Dizziness    Current Problems (verified) Patient Active Problem List   Diagnosis Date Noted  . Hyperlipidemia, mixed 10/13/2018  . Vitamin D deficiency 10/13/2018  . Essential hypertension 09/15/2018  . History of CVA (cerebrovascular accident) without residual deficits 08/28/2018    Screening Tests Immunization History  Administered Date(s) Administered  . PFIZER SARS-COV-2 Vaccination 01/28/2020, 02/18/2020    Preventative care: Last colonoscopy: declines, would not get treatment Last mammogram: declines, would not get treatment  Last pap smear/pelvic exam: remote, last 25 years ago, never abnormal, declines further  DEXA: declines   Prior vaccinations: DECLINES ALL VACCINES TD or Tdap: declines  Influenza: declines   Pneumococcal: declines Prevnar13: declines Shingles/Zostavax: declines   Covid 19: 2/2, 2021, pfizer  Names of Other Physician/Practitioners you currently use: 1. [facility] here for primary care 2. [facility], eye doctor, last visit ?2018, no issues, but plans to follow up  3. Dr. Delane Ginger, dentist, last visit 2021, 1-2 times per year,  ha  Patient Care Team: Lucky Cowboy, MD as PCP - General (Internal Medicine)  SURGICAL HISTORY She  has a past surgical history that includes cataracts and cataract surgery. FAMILY HISTORY Her family history includes COPD in her mother; Heart attack (age of onset: 3) in her maternal aunt; Heart attack (age of onset: 32) in her maternal uncle; Heart attack (age of onset: 98) in her mother; Hyperlipidemia in her maternal grandfather; Stroke in her maternal grandfather; Transient ischemic attack in her mother. SOCIAL HISTORY She  reports that she has never smoked. She has never used smokeless tobacco. She reports current alcohol use. She reports that she does not use drugs.   MEDICARE WELLNESS OBJECTIVES: Physical activity: Current Exercise Habits: Home exercise routine, Type of exercise: treadmill;walking, Time (Minutes): 60, Frequency (Times/Week): 7, Weekly Exercise (Minutes/Week): 420, Intensity: Mild, Exercise limited by: None identified Cardiac risk factors: Cardiac Risk Factors include: advanced age (>76men, >76 women);dyslipidemia;hypertension;family history of premature cardiovascular disease  Depression/mood screen:   Depression screen [facility] 2/9 07/26/2020  Decreased Interest 0  Down, Depressed, Hopeless 0  PHQ - 2 Score 0    ADLs:  In your present state of health, do you have any difficulty performing the following activities: 07/26/2020 01/18/2020  Hearing? N N  Vision? N N  Difficulty concentrating or making  decisions? N N  Walking or climbing stairs? N N  Dressing or bathing? N N  Doing errands, shopping? N N  Some recent data might be hidden     Cognitive Testing  Alert? Yes  Normal Appearance?Yes  Oriented to person? Yes  Place? Yes   Time? Yes  Recall of three objects?  Yes  Can perform simple calculations? Yes  Displays appropriate judgment?Yes  Can read the correct time from a watch face?Yes  EOL planning: Does Patient Have a Medical Advance Directive?:  No Would patient like information on creating a medical advance directive?: Yes (MAU/Ambulatory/Procedural Areas - Information given)  Review of Systems  Constitutional: Negative for malaise/fatigue and weight loss.  HENT: Negative for hearing loss and tinnitus.   Eyes: Negative for blurred vision and double vision.  Respiratory: Negative for cough, shortness of breath and wheezing.   Cardiovascular: Negative for chest pain, palpitations, orthopnea, claudication and leg swelling.  Gastrointestinal: Negative for abdominal pain, blood in stool, constipation, diarrhea, heartburn, melena, nausea and vomiting.  Genitourinary: Negative.   Musculoskeletal: Negative for joint pain and myalgias.  Skin: Negative for rash.  Neurological: Negative for dizziness, tingling, sensory change, weakness and headaches.  Endo/Heme/Allergies: Negative for polydipsia.  Psychiatric/Behavioral: Negative.   All other systems reviewed and are negative.    Objective:     Today's Vitals   07/26/20 1608  BP: 128/80  Pulse: 61  Temp: (!) 97.1 F (36.2 C)  SpO2: 99%  Weight: 140 lb (63.5 kg)   Body mass index is 23.3 kg/m.  General appearance: alert, no distress, WD/WN, female HEENT: normocephalic, sclerae anicteric, TMs pearly, nares patent, no discharge or erythema, pharynx normal Oral cavity: MMM, no lesions Neck: supple, no lymphadenopathy, no thyromegaly, no masses Heart: RRR, normal S1, S2, no murmurs Lungs: CTA bilaterally, no wheezes, rhonchi, or rales Abdomen: +bs, soft, non tender, non distended, no masses, no hepatomegaly, no splenomegaly Musculoskeletal: nontender, no swelling, no obvious deformity Extremities: no edema, no cyanosis, no clubbing Pulses: 2+ symmetric, upper and lower extremities, normal cap refill Neurological: alert, oriented x 3, CN2-12 intact, strength normal upper extremities and lower extremities, sensation normal throughout, DTRs 2+ throughout, no cerebellar signs, gait  normal Psychiatric: normal affect, behavior normal, pleasant   Medicare Attestation I have personally reviewed: The patient's medical and social history Their use of alcohol, tobacco or illicit drugs Their current medications and supplements The patient's functional ability including ADLs,fall risks, home safety risks, cognitive, and hearing and visual impairment Diet and physical activities Evidence for depression or mood disorders  The patient's weight, height, BMI, and visual acuity have been recorded in the chart.  I have made referrals, counseling, and provided education to the patient based on review of the above and I have provided the patient with a written personalized care plan for preventive services.     Dan Maker, NP   07/26/2020

## 2020-07-26 ENCOUNTER — Other Ambulatory Visit: Payer: Self-pay

## 2020-07-26 ENCOUNTER — Encounter: Payer: Self-pay | Admitting: Adult Health

## 2020-07-26 ENCOUNTER — Ambulatory Visit (INDEPENDENT_AMBULATORY_CARE_PROVIDER_SITE_OTHER): Payer: Medicare Other | Admitting: Adult Health

## 2020-07-26 VITALS — BP 128/80 | HR 61 | Temp 97.1°F | Wt 140.0 lb

## 2020-07-26 DIAGNOSIS — Z1159 Encounter for screening for other viral diseases: Secondary | ICD-10-CM

## 2020-07-26 DIAGNOSIS — Z8673 Personal history of transient ischemic attack (TIA), and cerebral infarction without residual deficits: Secondary | ICD-10-CM | POA: Diagnosis not present

## 2020-07-26 DIAGNOSIS — Z5320 Procedure and treatment not carried out because of patient's decision for unspecified reasons: Secondary | ICD-10-CM | POA: Diagnosis not present

## 2020-07-26 DIAGNOSIS — Z6823 Body mass index (BMI) 23.0-23.9, adult: Secondary | ICD-10-CM | POA: Diagnosis not present

## 2020-07-26 DIAGNOSIS — E782 Mixed hyperlipidemia: Secondary | ICD-10-CM

## 2020-07-26 DIAGNOSIS — I10 Essential (primary) hypertension: Secondary | ICD-10-CM | POA: Diagnosis not present

## 2020-07-26 DIAGNOSIS — Z0001 Encounter for general adult medical examination with abnormal findings: Secondary | ICD-10-CM

## 2020-07-26 DIAGNOSIS — Z0000 Encounter for general adult medical examination without abnormal findings: Secondary | ICD-10-CM

## 2020-07-26 DIAGNOSIS — E559 Vitamin D deficiency, unspecified: Secondary | ICD-10-CM | POA: Diagnosis not present

## 2020-07-26 DIAGNOSIS — R6889 Other general symptoms and signs: Secondary | ICD-10-CM | POA: Diagnosis not present

## 2020-07-27 LAB — COMPLETE METABOLIC PANEL WITH GFR
AG Ratio: 1.5 (calc) (ref 1.0–2.5)
ALT: 29 U/L (ref 6–29)
AST: 29 U/L (ref 10–35)
Albumin: 4.1 g/dL (ref 3.6–5.1)
Alkaline phosphatase (APISO): 87 U/L (ref 37–153)
BUN: 16 mg/dL (ref 7–25)
CO2: 35 mmol/L — ABNORMAL HIGH (ref 20–32)
Calcium: 9.5 mg/dL (ref 8.6–10.4)
Chloride: 99 mmol/L (ref 98–110)
Creat: 0.83 mg/dL (ref 0.60–0.93)
GFR, Est African American: 83 mL/min/{1.73_m2} (ref >=60)
GFR, Est Non African American: 71 mL/min/{1.73_m2} (ref >=60)
Globulin: 2.7 g/dL (calc) (ref 1.9–3.7)
Glucose, Bld: 76 mg/dL (ref 65–99)
Potassium: 4.7 mmol/L (ref 3.5–5.3)
Sodium: 138 mmol/L (ref 135–146)
Total Bilirubin: 0.5 mg/dL (ref 0.2–1.2)
Total Protein: 6.8 g/dL (ref 6.1–8.1)

## 2020-07-27 LAB — CBC WITH DIFFERENTIAL/PLATELET
Absolute Monocytes: 551 cells/uL (ref 200–950)
Basophils Absolute: 21 cells/uL (ref 0–200)
Basophils Relative: 0.4 %
Eosinophils Absolute: 111 cells/uL (ref 15–500)
Eosinophils Relative: 2.1 %
HCT: 39.8 % (ref 35.0–45.0)
Hemoglobin: 13.2 g/dL (ref 11.7–15.5)
Lymphs Abs: 2030 cells/uL (ref 850–3900)
MCH: 30.7 pg (ref 27.0–33.0)
MCHC: 33.2 g/dL (ref 32.0–36.0)
MCV: 92.6 fL (ref 80.0–100.0)
MPV: 9.3 fL (ref 7.5–12.5)
Monocytes Relative: 10.4 %
Neutro Abs: 2586 cells/uL (ref 1500–7800)
Neutrophils Relative %: 48.8 %
Platelets: 279 10*3/uL (ref 140–400)
RBC: 4.3 10*6/uL (ref 3.80–5.10)
RDW: 12.4 % (ref 11.0–15.0)
Total Lymphocyte: 38.3 %
WBC: 5.3 10*3/uL (ref 3.8–10.8)

## 2020-07-27 LAB — LIPID PANEL
Cholesterol: 145 mg/dL (ref <200)
HDL: 57 mg/dL (ref >=50)
LDL Cholesterol (Calc): 65 mg/dL (calc)
Non-HDL Cholesterol (Calc): 88 mg/dL (calc) (ref <130)
Total CHOL/HDL Ratio: 2.5 (calc) (ref <5.0)
Triglycerides: 156 mg/dL — ABNORMAL HIGH (ref <150)

## 2020-07-27 LAB — TSH: TSH: 2.02 mIU/L (ref 0.40–4.50)

## 2020-07-27 LAB — HEPATITIS C ANTIBODY
Hepatitis C Ab: NONREACTIVE
SIGNAL TO CUT-OFF: 0.01 (ref <1.00)

## 2020-09-19 DIAGNOSIS — Z23 Encounter for immunization: Secondary | ICD-10-CM | POA: Diagnosis not present

## 2020-09-26 ENCOUNTER — Other Ambulatory Visit: Payer: Self-pay | Admitting: Internal Medicine

## 2021-01-18 ENCOUNTER — Encounter: Payer: Medicare Other | Admitting: Internal Medicine

## 2021-02-23 ENCOUNTER — Other Ambulatory Visit: Payer: Self-pay

## 2021-02-23 MED ORDER — ATORVASTATIN CALCIUM 80 MG PO TABS: ORAL_TABLET | ORAL | 0 refills | Status: DC

## 2021-02-23 NOTE — Telephone Encounter (Signed)
Refill on ATORVASTATIN 80 mgs, refill sent as last filled.

## 2021-04-12 ENCOUNTER — Encounter: Payer: Self-pay | Admitting: Internal Medicine

## 2021-04-12 ENCOUNTER — Other Ambulatory Visit: Payer: Self-pay

## 2021-04-12 ENCOUNTER — Ambulatory Visit (INDEPENDENT_AMBULATORY_CARE_PROVIDER_SITE_OTHER): Payer: Medicare Other | Admitting: Internal Medicine

## 2021-04-12 VITALS — BP 138/86 | HR 59 | Temp 97.7°F | Resp 16 | Ht 65.5 in | Wt 139.8 lb

## 2021-04-12 DIAGNOSIS — Z8673 Personal history of transient ischemic attack (TIA), and cerebral infarction without residual deficits: Secondary | ICD-10-CM

## 2021-04-12 DIAGNOSIS — R0989 Other specified symptoms and signs involving the circulatory and respiratory systems: Secondary | ICD-10-CM | POA: Diagnosis not present

## 2021-04-12 DIAGNOSIS — E782 Mixed hyperlipidemia: Secondary | ICD-10-CM

## 2021-04-12 DIAGNOSIS — Z79899 Other long term (current) drug therapy: Secondary | ICD-10-CM | POA: Diagnosis not present

## 2021-04-12 DIAGNOSIS — E559 Vitamin D deficiency, unspecified: Secondary | ICD-10-CM | POA: Diagnosis not present

## 2021-04-12 DIAGNOSIS — Z136 Encounter for screening for cardiovascular disorders: Secondary | ICD-10-CM | POA: Diagnosis not present

## 2021-04-12 DIAGNOSIS — Z8249 Family history of ischemic heart disease and other diseases of the circulatory system: Secondary | ICD-10-CM | POA: Diagnosis not present

## 2021-04-12 DIAGNOSIS — Z1211 Encounter for screening for malignant neoplasm of colon: Secondary | ICD-10-CM

## 2021-04-12 DIAGNOSIS — R7309 Other abnormal glucose: Secondary | ICD-10-CM | POA: Diagnosis not present

## 2021-04-12 NOTE — Progress Notes (Signed)
Comprehensive Evaluation &  Examination   Future Appointments  Date Time Provider Department Center  08/08/2021  3:00 PM Judd Gaudier, NP [facility] None  04/18/2022  3:00 PM Lucky Cowboy, MD [facility] None        This very nice 70 y.o.  MWFpresents for a  comprehensive evaluation and management of multiple medical co-morbidities.  Patient has been followed for HTN, HLD,  Prediabetes  and Vitamin D Deficiency.       patient has hx/o mild labile HTN, 1st noted when she had a Rt Brain Caudate Nucleus CVA w/left Hemiplegia which recovered without sequela in Sept 2019. Patient's BP has been controlled at home and patient denies any cardiac symptoms as chest pain, palpitations, shortness of breath, dizziness or ankle swelling. Today's BP is at goal - 138/86.       Patient's hyperlipidemia is controlled with diet and Atorvastatin. Patient denies myalgias or other medication SE's. Last lipids were at goal:  Lab Results  Component Value Date   CHOL 145 07/26/2020   HDL 57 07/26/2020   LDLCALC 65 07/26/2020   TRIG 156 (H) 07/26/2020   CHOLHDL 2.5 07/26/2020        Patient his monitored expectantly for abn glucose or or glucose intolerance.  Patient denies reactive hypoglycemic symptoms, visual blurring, diabetic polys or paresthesias. Last A1c was normal & at goal:  Lab Results  Component Value Date   HGBA1C 5.5 01/19/2020        Finally, patient has history of Vitamin D Deficiency ("35" /2019) and last Vitamin D was still  not at goal (70-100):  Lab Results  Component Value Date   VD25OH 52 01/19/2020    Current Outpatient Medications on File Prior to Visit  Medication Sig  . aspirin EC 81 MG EC  Take 1 tablet  daily.  Marland Kitchen atorvastatin  80 MG  Take     1 tablet     Daily      . VITAMIN D 1000 UNIT Take 1,000 Units  daily.    Allergies  Allergen Reactions  . Codeine Other (See Comments)    Nausea , Dizziness    Past Medical History:  Diagnosis Date  . Stroke  [facility])     Health Maintenance  Topic Date Due  . COVID-19 Vaccine (3 - Booster for Pfizer series) 08/17/2020  . TETANUS/TDAP  07/26/2021 (Originally 03/03/1970)  . INFLUENZA VACCINE  07/23/2021  . Hepatitis C Screening  Completed  . HPV VACCINES  Aged Out  . MAMMOGRAM  Discontinued  . DEXA SCAN  Discontinued  . COLONOSCOPY Discontinued  . PNA vac Low Risk Adult  Discontinued    Immunization History  Administered Date(s) Administered  . PFIZER  SARS-COV-2 Vacc 01/28/2020, 02/18/2020    Last Colon - Relates had "years ago" and refuses to schedule - last year seemed  agreeable to do Cologard, but  she never returned last year.   Last MGM - refuses  Past Surgical History:  Procedure Laterality Date  . cataract surgery    . cataracts      Family History  Problem Relation Age of Onset  . Transient ischemic attack Mother   . COPD Mother   . Heart attack Mother 47       smoker, triple bypass  . Stroke Maternal Grandfather   . Hyperlipidemia Maternal Grandfather        triglycerides  . Heart attack Maternal Aunt 45       smoker  . Heart attack  Maternal Uncle 22       smoker    Social History   Tobacco Use  . Smoking status: Never Smoker  . Smokeless tobacco: Never Used  Vaping Use  . Vaping Use: Never used  Substance Use Topics  . Alcohol use: Yes    Comment: occasional wine  . Drug use: Never    ROS Constitutional: Denies fever, chills, weight loss/gain, headaches, insomnia,  night sweats, and change in appetite. Does c/o fatigue. Eyes: Denies redness, blurred vision, diplopia, discharge, itchy, watery eyes.  ENT: Denies discharge, congestion, post nasal drip, epistaxis, sore throat, earache, hearing loss, dental pain, Tinnitus, Vertigo, Sinus pain, snoring.  Cardio: Denies chest pain, palpitations, irregular heartbeat, syncope, dyspnea, diaphoresis, orthopnea, PND, claudication, edema Respiratory: denies cough, dyspnea, DOE, pleurisy, hoarseness, laryngitis,  wheezing.  Gastrointestinal: Denies dysphagia, heartburn, reflux, water brash, pain, cramps, nausea, vomiting, bloating, diarrhea, constipation, hematemesis, melena, hematochezia, jaundice, hemorrhoids Genitourinary: Denies dysuria, frequency, urgency, nocturia, hesitancy, discharge, hematuria, flank pain Breast: Breast lumps, nipple discharge, bleeding.  Musculoskeletal: Denies arthralgia, myalgia, stiffness, Jt. Swelling, pain, limp, and strain/sprain. Denies falls. Skin: Denies puritis, rash, hives, warts, acne, eczema, changing in skin lesion Neuro: No weakness, tremor, incoordination, spasms, paresthesia, pain Psychiatric: Denies confusion, memory loss, sensory loss. Denies Depression. Endocrine: Denies change in weight, skin, hair change, nocturia, and paresthesia, diabetic polys, visual blurring, hyper / hypo glycemic episodes.  Heme/Lymph: No excessive bleeding, bruising, enlarged lymph nodes.  Physical Exam  BP 138/86   Pulse (!) 59   Temp 97.7 F (36.5 C)   Resp 16   Ht 5' 5.5" (1.664 m)   Wt 139 lb 12.8 oz (63.4 kg)   SpO2 99%   BMI 22.91 kg/m   General Appearance: Well nourished, well groomed and in no apparent distress.  Eyes: PERRLA, EOMs, conjunctiva no swelling or erythema, normal fundi and vessels. Sinuses: No frontal/maxillary tenderness ENT/Mouth: EACs patent / TMs  nl. Nares clear without erythema, swelling, mucoid exudates. Oral hygiene is good. No erythema, swelling, or exudate. Tongue normal, non-obstructing. Tonsils not swollen or erythematous. Hearing normal.  Neck: Supple, thyroid not palpable. No bruits, nodes or JVD. Respiratory: Respiratory effort normal.  BS equal and clear bilateral without rales, rhonci, wheezing or stridor. Cardio: Heart sounds are normal with regular rate and rhythm and no murmurs, rubs or gallops. Peripheral pulses are normal and equal bilaterally without edema. No aortic or femoral bruits. Chest: symmetric with normal excursions and  percussion. Breasts: Symmetric, without lumps, nipple discharge, retractions, or fibrocystic changes.  Abdomen: Flat, soft with bowel sounds active. Nontender, no guarding, rebound, hernias, masses, or organomegaly.  Lymphatics: Non tender without lymphadenopathy.  Musculoskeletal: Full ROM all peripheral extremities, joint stability, 5/5 strength, and normal gait. Skin: Warm and dry without rashes, lesions, cyanosis, clubbing or  ecchymosis.  Neuro: Cranial nerves intact, reflexes equal bilaterally. Normal muscle tone, no cerebellar symptoms. Sensation intact.  Pysch: Alert and oriented X 3, normal affect, Insight and Judgment appropriate.   Assessment and Plan   1. Labile hypertension  - EKG 12-Lead - Urinalysis, Routine w reflex microscopic - Microalbumin / creatinine urine ratio - CBC with Differential/Platelet - COMPLETE METABOLIC PANEL WITH GFR - Magnesium - TSH  2. Hyperlipidemia, mixed  - Lipid panel - TSH  3. Abnormal glucose  - Hemoglobin A1c - Insulin, random  4. Vitamin D deficiency  - VITAMIN D 25 Hydroxy  5. History of CVA without residual deficits   6. Screening for colorectal cancer  - POC Hemoccult  Bld/Stl   7. Screening for ischemic heart disease  - EKG 12-Lead  8. FHx: heart disease  - EKG 12-Lead  9. Medication management  - Urinalysis, Routine w reflex microscopic - Microalbumin / creatinine urine ratio - Magnesium - Lipid panel - TSH - Hemoglobin A1c - Insulin, random - VITAMIN D 25 Hydroxy         Patient was counseled in prudent diet to achieve/maintain BMI less than 25 for weight control, BP monitoring, regular exercise and medications. Discussed med's effects and SE's. Screening labs and tests as requested with regular follow-up as recommended. Over 40 minutes of exam, counseling, chart review and high complex critical decision making was performed.   Marinus Maw, MD

## 2021-04-12 NOTE — Patient Instructions (Signed)

## 2021-04-13 LAB — CBC WITH DIFFERENTIAL/PLATELET
Absolute Monocytes: 546 cells/uL (ref 200–950)
Basophils Absolute: 20 cells/uL (ref 0–200)
Basophils Relative: 0.3 %
Eosinophils Absolute: 98 cells/uL (ref 15–500)
Eosinophils Relative: 1.5 %
HCT: 42.2 % (ref 35.0–45.0)
Hemoglobin: 14.1 g/dL (ref 11.7–15.5)
Lymphs Abs: 2555 cells/uL (ref 850–3900)
MCH: 30.7 pg (ref 27.0–33.0)
MCHC: 33.4 g/dL (ref 32.0–36.0)
MCV: 91.9 fL (ref 80.0–100.0)
MPV: 9.8 fL (ref 7.5–12.5)
Monocytes Relative: 8.4 %
Neutro Abs: 3283 cells/uL (ref 1500–7800)
Neutrophils Relative %: 50.5 %
Platelets: 306 10*3/uL (ref 140–400)
RBC: 4.59 10*6/uL (ref 3.80–5.10)
RDW: 12.2 % (ref 11.0–15.0)
Total Lymphocyte: 39.3 %
WBC: 6.5 10*3/uL (ref 3.8–10.8)

## 2021-04-13 LAB — LIPID PANEL
Cholesterol: 151 mg/dL (ref <200)
HDL: 60 mg/dL (ref >=50)
LDL Cholesterol (Calc): 67 mg/dL (calc)
Non-HDL Cholesterol (Calc): 91 mg/dL (calc) (ref <130)
Total CHOL/HDL Ratio: 2.5 (calc) (ref <5.0)
Triglycerides: 153 mg/dL — ABNORMAL HIGH (ref <150)

## 2021-04-13 LAB — COMPLETE METABOLIC PANEL WITH GFR
AG Ratio: 1.6 (calc) (ref 1.0–2.5)
ALT: 36 U/L — ABNORMAL HIGH (ref 6–29)
AST: 30 U/L (ref 10–35)
Albumin: 4.2 g/dL (ref 3.6–5.1)
Alkaline phosphatase (APISO): 83 U/L (ref 37–153)
BUN: 15 mg/dL (ref 7–25)
CO2: 31 mmol/L (ref 20–32)
Calcium: 9.8 mg/dL (ref 8.6–10.4)
Chloride: 100 mmol/L (ref 98–110)
Creat: 0.85 mg/dL (ref 0.60–0.93)
GFR, Est African American: 80 mL/min/{1.73_m2} (ref >=60)
GFR, Est Non African American: 69 mL/min/{1.73_m2} (ref >=60)
Globulin: 2.7 g/dL (calc) (ref 1.9–3.7)
Glucose, Bld: 83 mg/dL (ref 65–99)
Potassium: 4 mmol/L (ref 3.5–5.3)
Sodium: 138 mmol/L (ref 135–146)
Total Bilirubin: 0.5 mg/dL (ref 0.2–1.2)
Total Protein: 6.9 g/dL (ref 6.1–8.1)

## 2021-04-13 LAB — URINALYSIS, ROUTINE W REFLEX MICROSCOPIC
Bilirubin Urine: NEGATIVE
Glucose, UA: NEGATIVE
Hgb urine dipstick: NEGATIVE
Ketones, ur: NEGATIVE
Leukocytes,Ua: NEGATIVE
Nitrite: NEGATIVE
Protein, ur: NEGATIVE
Specific Gravity, Urine: 1.006 (ref 1.001–1.035)
pH: 7 (ref 5.0–8.0)

## 2021-04-13 LAB — HEMOGLOBIN A1C
Hgb A1c MFr Bld: 5.3 % of total Hgb (ref <5.7)
Mean Plasma Glucose: 105 mg/dL
eAG (mmol/L): 5.8 mmol/L

## 2021-04-13 LAB — MICROALBUMIN / CREATININE URINE RATIO
Creatinine, Urine: 24 mg/dL (ref 20–275)
Microalb, Ur: <0.2 mg/dL

## 2021-04-13 LAB — TSH: TSH: 2.33 mIU/L (ref 0.40–4.50)

## 2021-04-13 LAB — INSULIN, RANDOM: Insulin: 6.7 u[IU]/mL

## 2021-04-13 LAB — VITAMIN D 25 HYDROXY (VIT D DEFICIENCY, FRACTURES): Vit D, 25-Hydroxy: 39 ng/mL (ref 30–100)

## 2021-04-13 LAB — MAGNESIUM: Magnesium: 2.3 mg/dL (ref 1.5–2.5)

## 2021-04-13 NOTE — Progress Notes (Signed)
============================================================ -   Test results slightly outside the reference range are not unusual. If there is anything important, I will review this with you,  otherwise it is considered normal test values.  If you have further questions,  please do not hesitate to contact me at the office or via My Chart.  ============================================================ ============================================================  -  Total Chol = 151   and LDL Chol = 67   - Both    Excellent   - Very low risk for Heart Attack  / Stroke ============================================================ ============================================================  - A1c - Normal - Great - No Diabetes ! ============================================================ ============================================================  - Vitamin D = 39 - Low   - Vitamin D goal is between 70-100.   - Please INCREASE your Vitamin D to 5,000 units /aily   - It is very important as a natural anti-inflammatory and helping the  immune system protect against viral infections, like the Covid-19   - helping hair, skin, and nails, as well as reducing stroke and  heart attack risk.   - It helps your bones and helps with mood.  - It also decreases numerous cancer risks so please  take it as directed.   - Low Vit D is associated with a 200-300% higher risk for  CANCER   and 200-300% higher risk for HEART   ATTACK  &  STROKE.    - It is also associated with higher death rate at younger ages,   autoimmune diseases like Rheumatoid arthritis, Lupus,  Multiple Sclerosis.     - Also many other serious conditions, like depression, Alzheimer's  Dementia, infertility, muscle aches, fatigue, fibromyalgia   - just to name a few. ============================================================ ============================================================  - All Else - CBC - Kidneys -  Electrolytes - Liver - Magnesium & Thyroid    - all  Normal / OK ============================================================ ============================================================  - Keep up the [place] Work  !  ============================================================ ============================================================

## 2021-04-14 ENCOUNTER — Encounter: Payer: Self-pay | Admitting: Internal Medicine

## 2021-07-09 DIAGNOSIS — H25042 Posterior subcapsular polar age-related cataract, left eye: Secondary | ICD-10-CM | POA: Diagnosis not present

## 2021-08-08 ENCOUNTER — Ambulatory Visit: Payer: Medicare Other | Admitting: Adult Health

## 2021-10-03 ENCOUNTER — Other Ambulatory Visit: Payer: Self-pay | Admitting: Internal Medicine

## 2022-01-11 DIAGNOSIS — Z23 Encounter for immunization: Secondary | ICD-10-CM | POA: Diagnosis not present

## 2022-04-18 ENCOUNTER — Encounter: Payer: Medicare Other | Admitting: Internal Medicine

## 2022-06-11 NOTE — Progress Notes (Unsigned)
Comprehensive Evaluation &  Examination   Future Appointments  Date Time Provider Department  06/12/2022 11:00 AM Lucky Cowboy, MD [facility]  06/16/2023 11:00 AM Lucky Cowboy, MD [facility]        This very nice 71 y.o.  MWF  presents for a  comprehensive evaluation and management of multiple medical co-morbidities.  Patient has been followed for labile HTN, HLD,  Prediabetes  and Vitamin D Deficiency.       Patient was 1st dx'd  mild labile HTN when she had a Rt Brain CVA w/left Hemiplegia in Sept 2019  which recovered without sequela.  Patient adamantly refuses to take any meds for elevated BP's.  Patient denies any cardiac symptoms as chest pain, palpitations, shortness of breath, dizziness or ankle swelling. Today's BP is  elevated at 158/86  (Left arm) and rechecked  at 168/98.      Patient's hyperlipidemia is controlled with diet and Atorvastatin. Patient denies myalgias or other medication SE's. Last lipids were at goal:  Lab Results  Component Value Date   CHOL 151 04/12/2021   HDL 60 04/12/2021   LDLCALC 67 04/12/2021   TRIG 153 (H) 04/12/2021   CHOLHDL 2.5 04/12/2021       Patient is monitored expectantly for abn glucose / glucose intolerance.  Patient denies reactive hypoglycemic symptoms, visual blurring, diabetic polys or paresthesias. Last A1c was normal & at goal:      Finally, patient has history of Vitamin D Deficiency ("35" /2019) and last Vitamin D was still  not at goal (70-100):  Lab Results  Component Value Date   VD25OH 39 04/12/2021       Current Outpatient Medications on File Prior to Visit  Medication Sig   aspirin EC 81 MG EC  Take 1 tablet  daily.   atorvastatin  80 MG  Take     1 tablet     Daily    (Takes 1 tablet every other day )    VITAMIN D 1000 UNIT Take 1,000 Units  daily.    Allergies  Allergen Reactions   Codeine Other (See Comments)    Nausea , Dizziness    Past Medical History:  Diagnosis Date   Stroke [facility])      Health Maintenance  Topic Date Due   COVID-19 Vaccine (3 - Booster for Pfizer series) 08/17/2020   TETANUS/TDAP  07/26/2021 (Originally 03/03/1970)   INFLUENZA VACCINE  07/23/2021   Hepatitis C Screening  Completed   HPV VACCINES  Aged Out   MAMMOGRAM  Discontinued   DEXA SCAN  Discontinued   COLONOSCOPY Discontinued   PNA vac Low Risk Adult  Discontinued    Immunization History  Administered Date(s) Administered   PFIZER  SARS-COV-2 Vacc 01/28/2020, 02/18/2020    Last Colon - Relates had "years ago" and refuses to schedule - last year seemed  agreeable to do Cologard, but  she never returned last year.   Last MGM - refuses   Past Surgical History:  Procedure Laterality Date   cataract surgery     cataracts      Family History  Problem Relation Age of Onset   Transient ischemic attack Mother    COPD Mother    Heart attack Mother 56       smoker, triple bypass   Stroke Maternal Grandfather    Hyperlipidemia Maternal Grandfather        triglycerides   Heart attack Maternal Aunt 45       smoker  Heart attack Maternal Uncle 60       smoker    Social History   Tobacco Use   Smoking status: Never Smoker   Smokeless tobacco: Never Used  Vaping Use   Vaping Use: Never used  Substance Use Topics   Alcohol use: Yes    Comment: occasional wine   Drug use: Never    ROS Constitutional: Denies fever, chills, weight loss/gain, headaches, insomnia,  night sweats, and change in appetite. Does c/o fatigue. Eyes: Denies redness, blurred vision, diplopia, discharge, itchy, watery eyes.  ENT: Denies discharge, congestion, post nasal drip, epistaxis, sore throat, earache, hearing loss, dental pain, Tinnitus, Vertigo, Sinus pain, snoring.  Cardio: Denies chest pain, palpitations, irregular heartbeat, syncope, dyspnea, diaphoresis, orthopnea, PND, claudication, edema Respiratory: denies cough, dyspnea, DOE, pleurisy, hoarseness, laryngitis, wheezing.  Gastrointestinal:  Denies dysphagia, heartburn, reflux, water brash, pain, cramps, nausea, vomiting, bloating, diarrhea, constipation, hematemesis, melena, hematochezia, jaundice, hemorrhoids Genitourinary: Denies dysuria, frequency, urgency, nocturia, hesitancy, discharge, hematuria, flank pain Breast: Breast lumps, nipple discharge, bleeding.  Musculoskeletal: Denies arthralgia, myalgia, stiffness, Jt. Swelling, pain, limp, and strain/sprain. Denies falls. Skin: Denies puritis, rash, hives, warts, acne, eczema, changing in skin lesion Neuro: No weakness, tremor, incoordination, spasms, paresthesia, pain Psychiatric: Denies confusion, memory loss, sensory loss. Denies Depression. Endocrine: Denies change in weight, skin, hair change, nocturia, and paresthesia, diabetic polys, visual blurring, hyper / hypo glycemic episodes.  Heme/Lymph: No excessive bleeding, bruising, enlarged lymph nodes.  Physical Exam  BP (!) 158/86   Pulse 75   Temp (!) 96.6 F (35.9 C)   Wt 138 lb 3.2 oz (62.7 kg)   SpO2 99%   BMI 22.65 kg/m   General Appearance: Well nourished, well groomed and in no apparent distress.  Eyes: PERRLA, EOMs, conjunctiva no swelling or erythema, normal fundi and vessels. Sinuses: No frontal/maxillary tenderness ENT/Mouth: EACs patent / TMs  nl. Nares clear without erythema, swelling, mucoid exudates. Oral hygiene is good. No erythema, swelling, or exudate. Tongue normal, non-obstructing. Tonsils not swollen or erythematous. Hearing normal.  Neck: Supple, thyroid not palpable. No bruits, nodes or JVD. Respiratory: Respiratory effort normal.  BS equal and clear bilateral without rales, rhonci, wheezing or stridor. Cardio: Heart sounds are normal with regular rate and rhythm and no murmurs, rubs or gallops. Peripheral pulses are normal and equal bilaterally without edema. No aortic or femoral bruits. Chest: symmetric with normal excursions and percussion. Breasts: Symmetric, atrophic without lumps,  nipple discharge, retractions, or fibrocystic changes.  Abdomen: Flat, soft with bowel sounds active. Nontender, no guarding, rebound, hernias, masses, or organomegaly.  Lymphatics: Non tender without lymphadenopathy.  Musculoskeletal: Full ROM all peripheral extremities, joint stability, 5/5 strength, and normal gait. Skin: Warm and dry without rashes, lesions, cyanosis, clubbing or  ecchymosis.  Neuro: Cranial nerves intact, reflexes equal bilaterally. Normal muscle tone, no cerebellar symptoms. Sensation intact.  Pysch: Alert and oriented X 3, normal affect, Insight and Judgment appropriate.    1. Labile hypertension  - Declines recommended medications  &  Declines to monitor BPs at home   - EKG 12-Lead - CBC with Differential/Platelet - COMPLETE METABOLIC PANEL WITH GFR - Magnesium  2. Hyperlipidemia, mixed  - EKG 12-Lead - Lipid panel  3. Abnormal glucose  - EKG 12-Lead - Magnesium  4. Vitamin D deficiency  - VITAMIN D 25 Hydroxy  5. History of CVA (cerebrovascular accident) without residual deficits   6. Screening for colorectal cancer  - POC Hemoccult Bld/Stl  7. Screening for heart disease  -  EKG 12-Lead  8. FHx: heart disease  - EKG 12-Lead  9. Vitamin B12 deficiency  - Vitamin B12  10. Medication management  - Urinalysis, Routine w reflex microscopic - Microalbumin / creatinine urine ratio - Lipid panel - TSH - Insulin, random - VITAMIN D 25 Hydroxy  - Hemoglobin A1c       Patient was counseled in prudent diet to maintain BMI less than 25 for weight control, BP monitoring, regular exercise and medications. Discussed med's effects and SE's. Screening labs and tests as requested with regular follow-up as recommended. Over 40 minutes of exam, counseling, chart review and high complex critical decision making was performed.   Marinus Maw, MD

## 2022-06-11 NOTE — Patient Instructions (Signed)

## 2022-06-12 ENCOUNTER — Ambulatory Visit (INDEPENDENT_AMBULATORY_CARE_PROVIDER_SITE_OTHER): Payer: Medicare Other | Admitting: Internal Medicine

## 2022-06-12 ENCOUNTER — Encounter: Payer: Self-pay | Admitting: Internal Medicine

## 2022-06-12 VITALS — BP 158/86 | HR 75 | Temp 96.6°F | Wt 138.2 lb

## 2022-06-12 DIAGNOSIS — Z136 Encounter for screening for cardiovascular disorders: Secondary | ICD-10-CM

## 2022-06-12 DIAGNOSIS — R0989 Other specified symptoms and signs involving the circulatory and respiratory systems: Secondary | ICD-10-CM | POA: Diagnosis not present

## 2022-06-12 DIAGNOSIS — E538 Deficiency of other specified B group vitamins: Secondary | ICD-10-CM

## 2022-06-12 DIAGNOSIS — R7309 Other abnormal glucose: Secondary | ICD-10-CM | POA: Diagnosis not present

## 2022-06-12 DIAGNOSIS — E559 Vitamin D deficiency, unspecified: Secondary | ICD-10-CM

## 2022-06-12 DIAGNOSIS — Z79899 Other long term (current) drug therapy: Secondary | ICD-10-CM

## 2022-06-12 DIAGNOSIS — Z8249 Family history of ischemic heart disease and other diseases of the circulatory system: Secondary | ICD-10-CM | POA: Diagnosis not present

## 2022-06-12 DIAGNOSIS — Z1211 Encounter for screening for malignant neoplasm of colon: Secondary | ICD-10-CM

## 2022-06-12 DIAGNOSIS — E782 Mixed hyperlipidemia: Secondary | ICD-10-CM | POA: Diagnosis not present

## 2022-06-12 DIAGNOSIS — Z8673 Personal history of transient ischemic attack (TIA), and cerebral infarction without residual deficits: Secondary | ICD-10-CM

## 2022-06-12 DIAGNOSIS — I10 Essential (primary) hypertension: Secondary | ICD-10-CM | POA: Diagnosis not present

## 2022-06-12 MED ORDER — ATORVASTATIN CALCIUM 80 MG PO TABS: ORAL_TABLET | ORAL | 3 refills | Status: DC

## 2022-06-13 LAB — CBC WITH DIFFERENTIAL/PLATELET
Absolute Monocytes: 346 cells/uL (ref 200–950)
Basophils Absolute: 22 cells/uL (ref 0–200)
Basophils Relative: 0.4 %
Eosinophils Absolute: 108 cells/uL (ref 15–500)
Eosinophils Relative: 2 %
HCT: 40.6 % (ref 35.0–45.0)
Hemoglobin: 13.6 g/dL (ref 11.7–15.5)
Lymphs Abs: 1798 cells/uL (ref 850–3900)
MCH: 30.4 pg (ref 27.0–33.0)
MCHC: 33.5 g/dL (ref 32.0–36.0)
MCV: 90.8 fL (ref 80.0–100.0)
MPV: 9.6 fL (ref 7.5–12.5)
Monocytes Relative: 6.4 %
Neutro Abs: 3127 cells/uL (ref 1500–7800)
Neutrophils Relative %: 57.9 %
Platelets: 297 10*3/uL (ref 140–400)
RBC: 4.47 10*6/uL (ref 3.80–5.10)
RDW: 12.2 % (ref 11.0–15.0)
Total Lymphocyte: 33.3 %
WBC: 5.4 10*3/uL (ref 3.8–10.8)

## 2022-06-13 LAB — LIPID PANEL
Cholesterol: 146 mg/dL (ref <200)
HDL: 62 mg/dL (ref >=50)
LDL Cholesterol (Calc): 60 mg/dL (calc)
Non-HDL Cholesterol (Calc): 84 mg/dL (calc) (ref <130)
Total CHOL/HDL Ratio: 2.4 (calc) (ref <5.0)
Triglycerides: 158 mg/dL — ABNORMAL HIGH (ref <150)

## 2022-06-13 LAB — COMPLETE METABOLIC PANEL WITH GFR
AG Ratio: 1.5 (calc) (ref 1.0–2.5)
ALT: 35 U/L — ABNORMAL HIGH (ref 6–29)
AST: 32 U/L (ref 10–35)
Albumin: 4.2 g/dL (ref 3.6–5.1)
Alkaline phosphatase (APISO): 85 U/L (ref 37–153)
BUN: 17 mg/dL (ref 7–25)
CO2: 30 mmol/L (ref 20–32)
Calcium: 9.9 mg/dL (ref 8.6–10.4)
Chloride: 102 mmol/L (ref 98–110)
Creat: 0.87 mg/dL (ref 0.60–1.00)
Globulin: 2.8 g/dL (calc) (ref 1.9–3.7)
Glucose, Bld: 80 mg/dL (ref 65–99)
Potassium: 4.2 mmol/L (ref 3.5–5.3)
Sodium: 140 mmol/L (ref 135–146)
Total Bilirubin: 0.5 mg/dL (ref 0.2–1.2)
Total Protein: 7 g/dL (ref 6.1–8.1)
eGFR: 71 mL/min/{1.73_m2} (ref >=60)

## 2022-06-13 LAB — MICROALBUMIN / CREATININE URINE RATIO
Creatinine, Urine: 17 mg/dL — ABNORMAL LOW (ref 20–275)
Microalb, Ur: <0.2 mg/dL

## 2022-06-13 LAB — URINALYSIS, ROUTINE W REFLEX MICROSCOPIC
Bilirubin Urine: NEGATIVE
Glucose, UA: NEGATIVE
Hgb urine dipstick: NEGATIVE
Ketones, ur: NEGATIVE
Leukocytes,Ua: NEGATIVE
Nitrite: NEGATIVE
Protein, ur: NEGATIVE
Specific Gravity, Urine: 1.005 (ref 1.001–1.035)
pH: 7 (ref 5.0–8.0)

## 2022-06-13 LAB — VITAMIN B12: Vitamin B-12: 330 pg/mL (ref 200–1100)

## 2022-06-13 LAB — MAGNESIUM: Magnesium: 2.3 mg/dL (ref 1.5–2.5)

## 2022-06-13 LAB — VITAMIN D 25 HYDROXY (VIT D DEFICIENCY, FRACTURES): Vit D, 25-Hydroxy: 45 ng/mL (ref 30–100)

## 2022-06-13 LAB — HEMOGLOBIN A1C
Hgb A1c MFr Bld: 5.3 % of total Hgb (ref <5.7)
Mean Plasma Glucose: 105 mg/dL
eAG (mmol/L): 5.8 mmol/L

## 2022-06-13 LAB — INSULIN, RANDOM: Insulin: 8.3 u[IU]/mL

## 2022-06-13 LAB — TSH: TSH: 1.85 mIU/L (ref 0.40–4.50)

## 2022-10-19 ENCOUNTER — Other Ambulatory Visit: Payer: Self-pay | Admitting: Internal Medicine

## 2022-10-19 DIAGNOSIS — E782 Mixed hyperlipidemia: Secondary | ICD-10-CM

## 2022-12-26 ENCOUNTER — Ambulatory Visit: Payer: PPO | Admitting: Nurse Practitioner

## 2023-02-06 ENCOUNTER — Encounter: Payer: Self-pay | Admitting: Nurse Practitioner

## 2023-02-06 ENCOUNTER — Ambulatory Visit (INDEPENDENT_AMBULATORY_CARE_PROVIDER_SITE_OTHER): Payer: PPO | Admitting: Nurse Practitioner

## 2023-02-06 VITALS — BP 128/88 | HR 76 | Temp 97.7°F | Ht 65.5 in | Wt 141.0 lb

## 2023-02-06 DIAGNOSIS — R0989 Other specified symptoms and signs involving the circulatory and respiratory systems: Secondary | ICD-10-CM | POA: Diagnosis not present

## 2023-02-06 DIAGNOSIS — R6889 Other general symptoms and signs: Secondary | ICD-10-CM

## 2023-02-06 DIAGNOSIS — Z79899 Other long term (current) drug therapy: Secondary | ICD-10-CM

## 2023-02-06 DIAGNOSIS — Z8673 Personal history of transient ischemic attack (TIA), and cerebral infarction without residual deficits: Secondary | ICD-10-CM

## 2023-02-06 DIAGNOSIS — Z0001 Encounter for general adult medical examination with abnormal findings: Secondary | ICD-10-CM | POA: Diagnosis not present

## 2023-02-06 DIAGNOSIS — E782 Mixed hyperlipidemia: Secondary | ICD-10-CM | POA: Diagnosis not present

## 2023-02-06 DIAGNOSIS — Z6823 Body mass index (BMI) 23.0-23.9, adult: Secondary | ICD-10-CM

## 2023-02-06 DIAGNOSIS — E559 Vitamin D deficiency, unspecified: Secondary | ICD-10-CM | POA: Diagnosis not present

## 2023-02-06 DIAGNOSIS — Z0000 Encounter for general adult medical examination without abnormal findings: Secondary | ICD-10-CM

## 2023-02-06 NOTE — Progress Notes (Signed)
MEDICARE ANNUAL WELLNESS VISIT AND FOLLOW UP  Assessment:    [name] was seen today for follow-up and medicare wellness.  Diagnoses and all orders for this visit:  Encounter for Medicare annual wellness exam Declines colon cancer screening, breast cancer screening - would not pursue treatment patient declines a colonoscopy even though the risks and benefits were discussed at length. Colon cancer is 3rd most diagnosed cancer and 2nd leading cause of death in both men and women 59 years of age and older. Patient understands the risk of cancer and death with declining the test, declines cologuard or hemoccult as well.  She declines all vaccines today; she would obtain tetanus booster if needed for dirty wound.   History of CVA (cerebrovascular accident) without residual deficits Continue ASA, statin Control blood pressure, cholesterol, glucose, increase exercise.   Essential hypertension At goal off of medications at this time; monitor Monitor blood pressure at home; call if consistently over 130/80 Continue DASH diet.   Reminder to go to the ER if any CP, SOB, nausea, dizziness, severe HA, changes vision/speech, left arm numbness and tingling and jaw pain.  Hyperlipidemia, mixed Continue medications: atorvastatin, asa Discussed lifestyle modifications. Recommended diet heavy in fruits and veggies, omega 3's. Decrease consumption of animal meats, cheeses, and dairy products. Remain active and exercise as tolerated. Continue to monitor. Check lipids   Vitamin D deficiency Continue supplement for goal of 60-100 Monitor levels   BMI 23.0-23.9, adult Discussed appropriate BMI Diet modification. Physical activity. Encouraged/praised to build confidence.   Medication management All medications discussed and reviewed in full. All questions and concerns regarding medications addressed.    Orders Placed This Encounter  Procedures   CBC with Differential/Platelet   COMPLETE  METABOLIC PANEL WITH GFR   Lipid panel   Hemoglobin A1c   VITAMIN D 25 Hydroxy (Vit-D Deficiency, Fractures)    Notify office for further evaluation and treatment, questions or concerns if any reported s/s fail to improve.   The patient was advised to call back or seek an in-person evaluation if any symptoms worsen or if the condition fails to improve as anticipated.   Further disposition pending results of labs. Discussed med's effects and SE's.    I discussed the assessment and treatment plan with the patient. The patient was provided an opportunity to ask questions and all were answered. The patient agreed with the plan and demonstrated an understanding of the instructions.  Discussed med's effects and SE's. Screening labs and tests as requested with regular follow-up as recommended.  I provided 35 minutes of face-to-face time during this encounter including counseling, chart review, and critical decision making was preformed.  Future Appointments  Date Time Provider [place]  06/16/2023 11:00 AM Unk Pinto, MD [facility] None     Plan:   During the course of the visit the patient was educated and counseled about appropriate screening and preventive services including:   Pneumococcal vaccine  Prevnar 13 Influenza vaccine Td vaccine Screening electrocardiogram Bone densitometry screening Colorectal cancer screening Diabetes screening Glaucoma screening Nutrition counseling  Advanced directives: requested   Subjective:  Melinda Burns is a 72 y.o. female who presents for Medicare Annual Wellness Visit and 3 month follow up.   She is still working, works in [employer]. Retired Educational psychologist.   Overall she reports feeling well.  She has no new concerns to report at this time.  Experienced a right basal ganglia infarct in 08/2018 secondary to small vessel disease following not presenting for  medical screening or management for several decades.   She has recovered without sequela and has been released by neurology. Continues with aspirin 81 mg and atorvastatin.   BMI is Body mass index is 23.11 kg/m., she has been working on diet and exercise, exercises 30-40 min twice daily, avoids fast food, fried food, <6 ounce of red meat weekly, fish and chicken, low fat daily, mainly fresh fruits and vegetables.  Wt Readings from Last 3 Encounters:  02/06/23 141 lb (64 kg)  06/12/22 138 lb 3.2 oz (62.7 kg)  04/12/21 139 lb 12.8 oz (63.4 kg)   Bps have typically been at goal; today their BP is BP: 128/88 She does workout. She denies chest pain, shortness of breath, dizziness.   She is on cholesterol medication (atorvastatin 80 mg every other day) and denies myalgias. Her cholesterol is at goal. The cholesterol last visit was:   Lab Results  Component Value Date   CHOL 146 06/12/2022   HDL 62 06/12/2022   LDLCALC 60 06/12/2022   TRIG 158 (H) 06/12/2022   CHOLHDL 2.4 06/12/2022    She has been working on diet and exercise for glucose management. Last A1C in the office was:  Lab Results  Component Value Date   HGBA1C 5.3 06/12/2022   Last GFR: Lab Results  Component Value Date   GFRNONAA 69 04/12/2021   Patient is on Vitamin D supplement.   Lab Results  Component Value Date   VD25OH 45 06/12/2022      Medication Review: Current Outpatient Medications on File Prior to Visit  Medication Sig Dispense Refill   aspirin EC 81 MG EC tablet Take 1 tablet (81 mg total) by mouth daily. 30 tablet 1   atorvastatin (LIPITOR) 80 MG tablet Take 1 tablet Daily for Cholesterol 90 tablet 3   cholecalciferol (VITAMIN D3) 25 MCG (1000 UNIT) tablet Take 1,000 Units by mouth daily.     No current facility-administered medications on file prior to visit.    Allergies  Allergen Reactions   Codeine Other (See Comments)    Nausea , Dizziness    Current Problems (verified) Patient Active Problem List   Diagnosis Date Noted   Hyperlipidemia,  mixed 10/13/2018   Vitamin D deficiency 10/13/2018   Essential hypertension 09/15/2018   History of CVA (cerebrovascular accident) without residual deficits 08/28/2018    Screening Tests Immunization History  Administered Date(s) Administered   PFIZER(Purple Top)SARS-COV-2 Vaccination 01/28/2020, 02/18/2020, 09/19/2020    Preventative care: Last colonoscopy: declines, would not get treatment Last mammogram: declines, would not get treatment  Last pap smear/pelvic exam: remote, last 25 years ago, never abnormal, declines further  DEXA: declines   Prior vaccinations: DECLINES ALL VACCINES TD or Tdap: declines  Influenza: declines   Pneumococcal: declines Prevnar13: declines Shingles/Zostavax: declines   Covid 19: 2/2, 2021, pfizer  Names of Other Physician/Practitioners you currently use: 1. [facility] here for primary care 2. [facility], eye doctor, last visit 2018, no issues, but plans to follow up  3. Dr. Gordy Levan, dentist, last visit 2021 1-2 times per year  Patient Care Team: Unk Pinto, MD as PCP - General (Internal Medicine)  SURGICAL HISTORY She  has a past surgical history that includes cataracts and cataract surgery. FAMILY HISTORY Her family history includes COPD in her mother; Heart attack (age of onset: 38) in her maternal aunt; Heart attack (age of onset: 21) in her maternal uncle; Heart attack (age of onset: 57) in her mother;  Hyperlipidemia in her maternal grandfather; Stroke in her maternal grandfather; Transient ischemic attack in her mother. SOCIAL HISTORY She  reports that she has never smoked. She has never used smokeless tobacco. She reports current alcohol use. She reports that she does not use drugs.   MEDICARE WELLNESS OBJECTIVES: Physical activity:   Cardiac risk factors:    Depression/mood screen:      02/06/2023    4:35 PM  Depression screen PHQ 2/9  Decreased Interest 0  Down, Depressed,  Hopeless 0  PHQ - 2 Score 0    ADLs:     02/06/2023    4:31 PM  In your present state of health, do you have any difficulty performing the following activities:  Hearing? 0  Vision? 0  Difficulty concentrating or making decisions? 0  Walking or climbing stairs? 0  Dressing or bathing? 0  Doing errands, shopping? 0  Preparing Food and eating ? N  Using the Toilet? N  In the past six months, have you accidently leaked urine? N  Do you have problems with loss of bowel control? N  Managing your Medications? N  Managing your Finances? N  Housekeeping or managing your Housekeeping? N     Cognitive Testing  Alert? Yes  Normal Appearance?Yes  Oriented to person? Yes  Place? Yes   Time? Yes  Recall of three objects?  Yes  Can perform simple calculations? Yes  Displays appropriate judgment?Yes  Can read the correct time from a watch face?Yes  EOL planning:    Review of Systems  Constitutional:  Negative for malaise/fatigue and weight loss.  HENT:  Negative for hearing loss and tinnitus.   Eyes:  Negative for blurred vision and double vision.  Respiratory:  Negative for cough, shortness of breath and wheezing.   Cardiovascular:  Negative for chest pain, palpitations, orthopnea, claudication and leg swelling.  Gastrointestinal:  Negative for abdominal pain, blood in stool, constipation, diarrhea, heartburn, melena, nausea and vomiting.  Genitourinary: Negative.   Musculoskeletal:  Negative for joint pain and myalgias.  Skin:  Negative for rash.  Neurological:  Negative for dizziness, tingling, sensory change, weakness and headaches.  Endo/Heme/Allergies:  Negative for polydipsia.  Psychiatric/Behavioral: Negative.    All other systems reviewed and are negative.    Objective:     Today's Vitals   02/06/23 1559  BP: 128/88  Pulse: 76  Temp: 97.7 F (36.5 C)  SpO2: 99%  Weight: 141 lb (64 kg)  Height: 5' 5.5" (1.664 m)    General appearance: alert, no distress, WD/WN,  female HEENT: normocephalic, sclerae anicteric, TMs pearly, nares patent, no discharge or erythema, pharynx normal Oral cavity: MMM, no lesions Neck: supple, no lymphadenopathy, no thyromegaly, no masses Heart: RRR, normal S1, S2, no murmurs Lungs: CTA bilaterally, no wheezes, rhonchi, or rales Abdomen: +bs, soft, non tender, non distended, no masses, no hepatomegaly, no splenomegaly Musculoskeletal: nontender, no swelling, no obvious deformity Extremities: no edema, no cyanosis, no clubbing Pulses: 2+ symmetric, upper and lower extremities, normal cap refill Neurological: alert, oriented x 3, CN2-12 intact, strength normal upper extremities and lower extremities, sensation normal throughout, DTRs 2+ throughout, no cerebellar signs, gait normal Psychiatric: normal affect, behavior normal, pleasant   Medicare Attestation I have personally reviewed: The patient's medical and social history Their use of alcohol, tobacco or illicit drugs Their current medications and supplements The patient's functional ability including ADLs,fall risks, home safety risks, cognitive, and hearing and visual impairment Diet and physical activities Evidence for depression or mood  disorders  The patient's weight, height, BMI, and visual acuity have been recorded in the chart.  I have made referrals, counseling, and provided education to the patient based on review of the above and I have provided the patient with a written personalized care plan for preventive services.     Darrol Jump, NP   02/06/2023

## 2023-02-06 NOTE — Patient Instructions (Signed)

## 2023-02-07 LAB — HEMOGLOBIN A1C
Hgb A1c MFr Bld: 5.5 % of total Hgb (ref <5.7)
Mean Plasma Glucose: 111 mg/dL
eAG (mmol/L): 6.2 mmol/L

## 2023-02-07 LAB — CBC WITH DIFFERENTIAL/PLATELET
Absolute Monocytes: 474 cells/uL (ref 200–950)
Basophils Absolute: 18 cells/uL (ref 0–200)
Basophils Relative: 0.3 %
Eosinophils Absolute: 78 cells/uL (ref 15–500)
Eosinophils Relative: 1.3 %
HCT: 39.4 % (ref 35.0–45.0)
Hemoglobin: 13.6 g/dL (ref 11.7–15.5)
Lymphs Abs: 2346 cells/uL (ref 850–3900)
MCH: 30.8 pg (ref 27.0–33.0)
MCHC: 34.5 g/dL (ref 32.0–36.0)
MCV: 89.1 fL (ref 80.0–100.0)
MPV: 9.5 fL (ref 7.5–12.5)
Monocytes Relative: 7.9 %
Neutro Abs: 3084 cells/uL (ref 1500–7800)
Neutrophils Relative %: 51.4 %
Platelets: 315 10*3/uL (ref 140–400)
RBC: 4.42 10*6/uL (ref 3.80–5.10)
RDW: 12 % (ref 11.0–15.0)
Total Lymphocyte: 39.1 %
WBC: 6 10*3/uL (ref 3.8–10.8)

## 2023-02-07 LAB — LIPID PANEL
Cholesterol: 133 mg/dL (ref <200)
HDL: 64 mg/dL (ref >=50)
LDL Cholesterol (Calc): 50 mg/dL (calc)
Non-HDL Cholesterol (Calc): 69 mg/dL (calc) (ref <130)
Total CHOL/HDL Ratio: 2.1 (calc) (ref <5.0)
Triglycerides: 111 mg/dL (ref <150)

## 2023-02-07 LAB — COMPLETE METABOLIC PANEL WITH GFR
AG Ratio: 1.7 (calc) (ref 1.0–2.5)
ALT: 36 U/L — ABNORMAL HIGH (ref 6–29)
AST: 34 U/L (ref 10–35)
Albumin: 4.4 g/dL (ref 3.6–5.1)
Alkaline phosphatase (APISO): 82 U/L (ref 37–153)
BUN: 17 mg/dL (ref 7–25)
CO2: 30 mmol/L (ref 20–32)
Calcium: 9.7 mg/dL (ref 8.6–10.4)
Chloride: 102 mmol/L (ref 98–110)
Creat: 1 mg/dL (ref 0.60–1.00)
Globulin: 2.6 g/dL (calc) (ref 1.9–3.7)
Glucose, Bld: 79 mg/dL (ref 65–99)
Potassium: 4.1 mmol/L (ref 3.5–5.3)
Sodium: 140 mmol/L (ref 135–146)
Total Bilirubin: 0.4 mg/dL (ref 0.2–1.2)
Total Protein: 7 g/dL (ref 6.1–8.1)
eGFR: 60 mL/min/{1.73_m2} (ref >=60)

## 2023-02-07 LAB — VITAMIN D 25 HYDROXY (VIT D DEFICIENCY, FRACTURES): Vit D, 25-Hydroxy: 50 ng/mL (ref 30–100)

## 2023-06-15 ENCOUNTER — Encounter: Payer: Self-pay | Admitting: Internal Medicine

## 2023-06-15 NOTE — Progress Notes (Unsigned)
Annual  Screening/Preventative Visit &  Comprehensive Evaluation & Examination   Future Appointments  Date Time Provider Department  06/16/2023 11:00 AM Lucky Cowboy, MD [facility]  06/29/2024 10:00 AM Lucky Cowboy, MD [facility]       This very nice 72 y.o.  recently widowed WF  presents for a  comprehensive evaluation and management of multiple medical co-morbidities.  Patient has been followed for labile HTN, HLD,  Prediabetes  and Vitamin D Deficiency.       Patient was 1st dx'd  mild labile HTN when she had a Rt Brain CVA w/left Hemiplegia in 2019  which recovered without sequela.  Patient adamantly refuses to take any meds for elevated BP's.  Patient denies any cardiac symptoms as chest pain, palpitations, shortness of breath, dizziness or ankle swelling. Today's BP is elevated at 181/92 .        BP Readings from Last 3 Encounters:  06/16/23 (!) 181/92  02/06/23 128/88  06/12/22 (!) 158/86        Patient's hyperlipidemia is controlled with diet and Atorvastatin. Patient denies myalgias or other medication SE's. Last lipids were at goal:  Lab Results  Component Value Date   CHOL 133 02/06/2023   HDL 64 02/06/2023   LDLCALC 50 02/06/2023   TRIG 111 02/06/2023   CHOLHDL 2.1 02/06/2023       Patient is monitored expectantly for abn glucose / glucose intolerance.  Patient denies reactive hypoglycemic symptoms, visual blurring, diabetic polys or paresthesias. Last A1c was normal & at goal:  Lab Results  Component Value Date   HGBA1C 5.5 02/06/2023        Finally, patient has history of Vitamin D Deficiency ("35" /2019) and last Vitamin D was still  not at goal (70-100):  Lab Results  Component Value Date   VD25OH 50 02/06/2023       Current Outpatient Medications on File Prior to Visit  Medication Sig   aspirin EC 81 MG EC  Take 1 tablet  daily.   atorvastatin  80 MG  Take     1 tablet     Daily    (Takes 1 tablet every other day )    VITAMIN D 1000  UNIT Take 1,000 Units  daily.     Allergies  Allergen Reactions   Codeine Other (See Comments)    Nausea , Dizziness     Past Medical History:  Diagnosis Date   Stroke [facility])      Health Maintenance  Topic Date Due   COVID-19 Vaccine (3 - Booster for Pfizer series) 08/17/2020   TETANUS/TDAP  07/26/2021 (Originally 03/03/1970)   INFLUENZA VACCINE  07/23/2021   Hepatitis C Screening  Completed   HPV VACCINES  Aged Out   MAMMOGRAM  Discontinued   DEXA SCAN  Discontinued   COLONOSCOPY Discontinued   PNA vac Low Risk Adult  Discontinued     Immunization History  Administered Date(s) Administered   PFIZER  SARS-COV-2 Vacc 01/28/2020, 02/18/2020    Last Colon - Relates had "years ago" and refuses to schedule - last year seemed  agreeable to do Cologard, but  she never returned last year.    Last MGM - refuses   Past Surgical History:  Procedure Laterality Date   cataract surgery     cataracts       Family History  Problem Relation Age of Onset   Transient ischemic attack Mother    COPD Mother    Heart attack Mother  63       smoker, triple bypass   Stroke Maternal Grandfather    Hyperlipidemia Maternal Grandfather        triglycerides   Heart attack Maternal Aunt 45       smoker   Heart attack Maternal Uncle 64       smoker     Social History   Tobacco Use   Smoking status: Never Smoker   Smokeless tobacco: Never Used  Vaping Use   Vaping Use: Never used  Substance Use Topics   Alcohol use: Yes    Comment: occasional wine   Drug use: Never      ROS Constitutional: Denies fever, chills, weight loss/gain, headaches, insomnia,  night sweats, and change in appetite. Does c/o fatigue. Eyes: Denies redness, blurred vision, diplopia, discharge, itchy, watery eyes.  ENT: Denies discharge, congestion, post nasal drip, epistaxis, sore throat, earache, hearing loss, dental pain, Tinnitus, Vertigo, Sinus pain, snoring.  Cardio: Denies chest pain,  palpitations, irregular heartbeat, syncope, dyspnea, diaphoresis, orthopnea, PND, claudication, edema Respiratory: denies cough, dyspnea, DOE, pleurisy, hoarseness, laryngitis, wheezing.  Gastrointestinal: Denies dysphagia, heartburn, reflux, water brash, pain, cramps, nausea, vomiting, bloating, diarrhea, constipation, hematemesis, melena, hematochezia, jaundice, hemorrhoids Genitourinary: Denies dysuria, frequency, urgency, nocturia, hesitancy, discharge, hematuria, flank pain Breast: Breast lumps, nipple discharge, bleeding.  Musculoskeletal: Denies arthralgia, myalgia, stiffness, Jt. Swelling, pain, limp, and strain/sprain. Denies falls. Skin: Denies puritis, rash, hives, warts, acne, eczema, changing in skin lesion Neuro: No weakness, tremor, incoordination, spasms, paresthesia, pain Psychiatric: Denies confusion, memory loss, sensory loss. Denies Depression. Endocrine: Denies change in weight, skin, hair change, nocturia, and paresthesia, diabetic polys, visual blurring, hyper / hypo glycemic episodes.  Heme/Lymph: No excessive bleeding, bruising, enlarged lymph nodes.   Physical Exam  BP (!) 181/92   Pulse (!) 59   Temp 97.9 F (36.6 C)   Resp 16   Ht 5' 5.5" (1.664 m)   Wt 140 lb 3.2 oz (63.6 kg)   SpO2 99%   BMI 22.98 kg/m   General Appearance: Well nourished, well groomed and in no apparent distress.  Eyes: PERRLA, EOMs, conjunctiva no swelling or erythema, normal fundi and vessels. Sinuses: No frontal/maxillary tenderness ENT/Mouth: EACs patent / TMs  nl. Nares clear without erythema, swelling, mucoid exudates. Oral hygiene is good. No erythema, swelling, or exudate. Tongue normal, non-obstructing. Tonsils not swollen or erythematous. Hearing normal.  Neck: Supple, thyroid not palpable. No bruits, nodes or JVD. Respiratory: Respiratory effort normal.  BS equal and clear bilateral without rales, rhonci, wheezing or stridor. Cardio: Heart sounds are normal with regular rate  and rhythm and no murmurs, rubs or gallops. Peripheral pulses are normal and equal bilaterally without edema. No aortic or femoral bruits. Chest: symmetric with normal excursions and percussion. Breasts: Symmetric, atrophic without lumps, nipple discharge, retractions, or fibrocystic changes.  Abdomen: Flat, soft with bowel sounds active. Nontender, no guarding, rebound, hernias, masses, or organomegaly.  Lymphatics: Non tender without lymphadenopathy.  Musculoskeletal: Full ROM all peripheral extremities, joint stability, 5/5 strength, and normal gait. Skin: Warm and dry without rashes, lesions, cyanosis, clubbing or  ecchymosis.  Neuro: Cranial nerves intact, reflexes equal bilaterally. Normal muscle tone, no cerebellar symptoms. Sensation intact.  Pysch: Alert and oriented X 3, normal affect, Insight and Judgment appropriate.    1. Encounter for general adult medical examination with abnormal findings   2. Labile hypertension  - Explained to patient her high risk for Heart attack &  another stroke with uncontrolled HTN  &                      encouraged her to take prescribed meds ( Olmesartan ) &                      monitor BPs & call if  BP remains elevated  - EKG 12-Lead - Urinalysis, Routine w reflex microscopic - Microalbumin / creatinine urine ratio - CBC with Differential/Platelet - COMPLETE METABOLIC PANEL WITH GFR - Magnesium - TSH   3. Hyperlipidemia, mixed  - EKG 12-Lead - Lipid panel - TSH   4. Abnormal glucose  - EKG 12-Lead - COMPLETE METABOLIC PANEL WITH GFR - Hemoglobin A1c - Insulin, random   5. Vitamin D deficiency  - VITAMIN D 25 Hydroxy    6. History of CVA (cerebrovascular accident) without residual deficits   7. Vitamin B12 deficiency  - Vitamin B12 - CBC with Differential/Platelet   8. Screening for colorectal cancer  - Cologuard   9. Screening for heart disease  - EKG 12-Lead   10. FHx: heart  disease  - EKG 12-Lead   11. Medication management  - Urinalysis, Routine w reflex microscopic - Microalbumin / creatinine urine ratio - CBC with Differential/Platelet - COMPLETE METABOLIC PANEL WITH GFR - Magnesium - Lipid panel - TSH - Hemoglobin A1c - Insulin, random - VITAMIN D 25 Hydroxy        Patient was counseled in prudent diet to maintain BMI less than 25 for weight control, BP monitoring, regular exercise and medications. Discussed med's effects and SE's. Screening labs and tests as requested with regular follow-up as recommended. Over 40 minutes of exam, counseling, chart review and high complex critical decision making was performed.   Marinus Maw, MD

## 2023-06-15 NOTE — Patient Instructions (Signed)

## 2023-06-16 ENCOUNTER — Encounter: Payer: Self-pay | Admitting: Internal Medicine

## 2023-06-16 ENCOUNTER — Ambulatory Visit (INDEPENDENT_AMBULATORY_CARE_PROVIDER_SITE_OTHER): Payer: PPO | Admitting: Internal Medicine

## 2023-06-16 VITALS — BP 181/92 | HR 59 | Temp 97.9°F | Resp 16 | Ht 65.5 in | Wt 140.2 lb

## 2023-06-16 DIAGNOSIS — E538 Deficiency of other specified B group vitamins: Secondary | ICD-10-CM | POA: Diagnosis not present

## 2023-06-16 DIAGNOSIS — Z8249 Family history of ischemic heart disease and other diseases of the circulatory system: Secondary | ICD-10-CM

## 2023-06-16 DIAGNOSIS — R0989 Other specified symptoms and signs involving the circulatory and respiratory systems: Secondary | ICD-10-CM

## 2023-06-16 DIAGNOSIS — Z0000 Encounter for general adult medical examination without abnormal findings: Secondary | ICD-10-CM

## 2023-06-16 DIAGNOSIS — Z79899 Other long term (current) drug therapy: Secondary | ICD-10-CM

## 2023-06-16 DIAGNOSIS — E782 Mixed hyperlipidemia: Secondary | ICD-10-CM

## 2023-06-16 DIAGNOSIS — Z8673 Personal history of transient ischemic attack (TIA), and cerebral infarction without residual deficits: Secondary | ICD-10-CM

## 2023-06-16 DIAGNOSIS — Z136 Encounter for screening for cardiovascular disorders: Secondary | ICD-10-CM | POA: Diagnosis not present

## 2023-06-16 DIAGNOSIS — R7309 Other abnormal glucose: Secondary | ICD-10-CM | POA: Diagnosis not present

## 2023-06-16 DIAGNOSIS — Z1211 Encounter for screening for malignant neoplasm of colon: Secondary | ICD-10-CM

## 2023-06-16 DIAGNOSIS — E559 Vitamin D deficiency, unspecified: Secondary | ICD-10-CM | POA: Diagnosis not present

## 2023-06-16 DIAGNOSIS — Z0001 Encounter for general adult medical examination with abnormal findings: Secondary | ICD-10-CM

## 2023-06-16 MED ORDER — OLMESARTAN MEDOXOMIL 20 MG PO TABS
ORAL_TABLET | ORAL | 3 refills | Status: DC
Start: 2023-06-16 — End: 2024-05-07

## 2023-06-17 LAB — CBC WITH DIFFERENTIAL/PLATELET
Absolute Monocytes: 449 cells/uL (ref 200–950)
Basophils Absolute: 20 cells/uL (ref 0–200)
Basophils Relative: 0.3 %
Eosinophils Absolute: 92 cells/uL (ref 15–500)
Eosinophils Relative: 1.4 %
HCT: 39.7 % (ref 35.0–45.0)
Hemoglobin: 13.3 g/dL (ref 11.7–15.5)
Lymphs Abs: 1881 cells/uL (ref 850–3900)
MCH: 30.5 pg (ref 27.0–33.0)
MCHC: 33.5 g/dL (ref 32.0–36.0)
MCV: 91.1 fL (ref 80.0–100.0)
MPV: 9.7 fL (ref 7.5–12.5)
Monocytes Relative: 6.8 %
Neutro Abs: 4158 cells/uL (ref 1500–7800)
Neutrophils Relative %: 63 %
Platelets: 273 10*3/uL (ref 140–400)
RBC: 4.36 10*6/uL (ref 3.80–5.10)
RDW: 12.2 % (ref 11.0–15.0)
Total Lymphocyte: 28.5 %
WBC: 6.6 10*3/uL (ref 3.8–10.8)

## 2023-06-17 LAB — URINALYSIS, ROUTINE W REFLEX MICROSCOPIC
Bilirubin Urine: NEGATIVE
Glucose, UA: NEGATIVE
Hgb urine dipstick: NEGATIVE
Ketones, ur: NEGATIVE
Leukocytes,Ua: NEGATIVE
Nitrite: NEGATIVE
Protein, ur: NEGATIVE
Specific Gravity, Urine: 1.004 (ref 1.001–1.035)
pH: 7 (ref 5.0–8.0)

## 2023-06-17 LAB — MICROALBUMIN / CREATININE URINE RATIO
Creatinine, Urine: 16 mg/dL — ABNORMAL LOW (ref 20–275)
Microalb, Ur: <0.2 mg/dL

## 2023-06-17 LAB — COMPLETE METABOLIC PANEL WITH GFR
AG Ratio: 1.5 (calc) (ref 1.0–2.5)
ALT: 37 U/L — ABNORMAL HIGH (ref 6–29)
AST: 38 U/L — ABNORMAL HIGH (ref 10–35)
Albumin: 4.3 g/dL (ref 3.6–5.1)
Alkaline phosphatase (APISO): 87 U/L (ref 37–153)
BUN: 17 mg/dL (ref 7–25)
CO2: 32 mmol/L (ref 20–32)
Calcium: 9.9 mg/dL (ref 8.6–10.4)
Chloride: 102 mmol/L (ref 98–110)
Creat: 0.9 mg/dL (ref 0.60–1.00)
Globulin: 2.8 g/dL (calc) (ref 1.9–3.7)
Glucose, Bld: 92 mg/dL (ref 65–99)
Potassium: 4.2 mmol/L (ref 3.5–5.3)
Sodium: 142 mmol/L (ref 135–146)
Total Bilirubin: 0.5 mg/dL (ref 0.2–1.2)
Total Protein: 7.1 g/dL (ref 6.1–8.1)
eGFR: 68 mL/min/{1.73_m2} (ref >=60)

## 2023-06-17 LAB — HEMOGLOBIN A1C
Hgb A1c MFr Bld: 5.5 % of total Hgb (ref <5.7)
Mean Plasma Glucose: 111 mg/dL
eAG (mmol/L): 6.2 mmol/L

## 2023-06-17 LAB — LIPID PANEL
Cholesterol: 136 mg/dL (ref <200)
HDL: 58 mg/dL (ref >=50)
LDL Cholesterol (Calc): 55 mg/dL (calc)
Non-HDL Cholesterol (Calc): 78 mg/dL (calc) (ref <130)
Total CHOL/HDL Ratio: 2.3 (calc) (ref <5.0)
Triglycerides: 142 mg/dL (ref <150)

## 2023-06-17 LAB — VITAMIN D 25 HYDROXY (VIT D DEFICIENCY, FRACTURES): Vit D, 25-Hydroxy: 55 ng/mL (ref 30–100)

## 2023-06-17 LAB — MAGNESIUM: Magnesium: 2.4 mg/dL (ref 1.5–2.5)

## 2023-06-17 LAB — VITAMIN B12: Vitamin B-12: 340 pg/mL (ref 200–1100)

## 2023-06-17 LAB — TSH: TSH: 1.72 mIU/L (ref 0.40–4.50)

## 2023-06-17 LAB — INSULIN, RANDOM: Insulin: 6.2 u[IU]/mL

## 2023-06-17 NOTE — Progress Notes (Signed)
^<^<^<^<^<^<^<^<^<^<^<^<^<^<^<^<^<^<^<^<^<^<^<^<^<^<^<^<^<^<^<^<^<^<^<^<^ ^>^>^>^>^>^>^>^>^>^>^>>^>^>^>^>^>^>^>^>^>^>^>^>^>^>^>^>^>^>^>^>^>^>^>^>^>  -  Test results slightly outside the reference range are not unusual. If there is anything important, I will review this with you,  otherwise it is considered normal test values.  If you have further questions,  please do not hesitate to contact me at the office or via My Chart.   ^<^<^<^<^<^<^<^<^<^<^<^<^<^<^<^<^<^<^<^<^<^<^<^<^<^<^<^<^<^<^<^<^<^<^<^<^ ^>^>^>^>^>^>^>^>^>^>^>^>^>^>^>^>^>^>^>^>^>^>^>^>^>^>^>^>^>^>^>^>^>^>^>^>^  -  Vitamin B12 = 340 is  Very Low                                                      (Ideal or Goal Vit B12 is between 450 - 1,100)   Low Vit B12 may be associated with Anemia , Fatigue,   Peripheral Neuropathy, Dementia, "Brain Fog", & Depression   - Recommend take a sub-lingual form of Vitamin B12 tablet   1,000 to 5,000 mcg tab that you dissolve under your tongue /Daily   - Can get Lavonia Dana - best price at [employer] or on [employer] ^>^>^>^>^>^>^>^>^>^>^>^>^>^>^>^>^>^>^>^>^>^>^>^>^>^>^>^>^>^>^>^>^>^>^>^>^  - Total Chol = 136   &   LDL = 55 - Both    Excellent   - Very low risk for Heart Attack  / Stroke  - Please continue Atorvastatin   ^>^>^>^>^>^>^>^>^>^>^>^>^>^>^>^>^>^>^>^>^>^>^>^>^>^>^>^>^>^>^>^>^>^>^>^>^ ^>^>^>^>^>^>^>^>^>^>^>^>^>^>^>^>^>^>^>^>^>^>^>^>^>^>^>^>^>^>^>^>^>^>^>^>^  -  A1c - Normal  - No Diabetes  - Great !   ^>^>^>^>^>^>^>^>^>^>^>^>^>^>^>^>^>^>^>^>^>^>^>^>^>^>^>^>^>^>^>^>^>^>^>^>^  - Vitamin D = 11 - Great - Please continue dosage same   ^>^>^>^>^>^>^>^>^>^>^>^>^>^>^>^>^>^>^>^>^>^>^>^>^>^>^>^>^>^>^>^>^>^>^>^>^  - All Else - CBC - Kidneys - Electrolytes - Liver - Magnesium & Thyroid    - all  Normal /  OK  ^>^>^>^>^>^>^>^>^>^>^>^>^>^>^>^>^>^>^>^>^>^>^>^>^>^>^>^>^>^>^>^>^>^>^>^>^ ^>^>^>^>^>^>^>^>^>^>^>^>^>^>^>^>^>^>^>^>^>^>^>^>^>^>^>^>^>^>^>^>^>^>^>^>^

## 2023-09-09 ENCOUNTER — Encounter: Payer: Self-pay | Admitting: Nurse Practitioner

## 2023-09-09 ENCOUNTER — Ambulatory Visit (INDEPENDENT_AMBULATORY_CARE_PROVIDER_SITE_OTHER): Payer: PPO | Admitting: Nurse Practitioner

## 2023-09-09 ENCOUNTER — Other Ambulatory Visit: Payer: Self-pay

## 2023-09-09 VITALS — BP 150/94 | HR 88 | Temp 97.5°F | Ht 65.5 in | Wt 139.0 lb

## 2023-09-09 DIAGNOSIS — I10 Essential (primary) hypertension: Secondary | ICD-10-CM

## 2023-09-09 DIAGNOSIS — Z1152 Encounter for screening for COVID-19: Secondary | ICD-10-CM | POA: Diagnosis not present

## 2023-09-09 DIAGNOSIS — J40 Bronchitis, not specified as acute or chronic: Secondary | ICD-10-CM

## 2023-09-09 LAB — POC COVID19 BINAXNOW: SARS Coronavirus 2 Ag: NEGATIVE

## 2023-09-09 MED ORDER — AZITHROMYCIN 250 MG PO TABS
ORAL_TABLET | ORAL | 1 refills | Status: DC
Start: 2023-09-09 — End: 2023-12-31

## 2023-09-09 MED ORDER — PREDNISONE 20 MG PO TABS
ORAL_TABLET | ORAL | 0 refills | Status: AC
Start: 2023-09-09 — End: 2023-09-20

## 2023-09-09 MED ORDER — PROMETHAZINE-DM 6.25-15 MG/5ML PO SYRP
5.0000 mL | ORAL_SOLUTION | Freq: Four times a day (QID) | ORAL | 1 refills | Status: DC | PRN
Start: 2023-09-09 — End: 2024-11-05

## 2023-09-09 NOTE — Progress Notes (Signed)
Assessment and Plan:  Melinda Burns was seen today for acute visit.  Diagnoses and all orders for this visit:  Encounter for screening for COVID-19 -     POC COVID-19- negative  Essential hypertension - Currently on Olmesartan 20 mg daily, not checking BP at home.  Elevated in office today and recommended increasing dose to 40 mg every day.  Pt adamantly declines.  Will focus on salt restriction, exercise and follow up in 1 month - continue DASH diet, exercise and monitor at home. Call if greater than 130/80.   Go to the ER if any chest pain, shortness of breath, nausea, dizziness, severe HA, changes vision/speech   Bronchitis Push fluids Use Mucinex as needed Suggested inhaler but pt declines If no improvement in symptoms by Monday notify the office -     azithromycin (ZITHROMAX) 250 MG tablet; Take 2 tablets (500 mg) on  Day 1,  followed by 1 tablet (250 mg) once daily on Days 2 through 5. -     predniSONE (DELTASONE) 20 MG tablet; 3 tablets daily with food for 3 days, 2 tabs daily for 3 days, 1 tab a day for 5 days. -     promethazine-dextromethorphan (PROMETHAZINE-DM) 6.25-15 MG/5ML syrup; Take 5 mLs by mouth 4 (four) times daily as needed for cough.       Further disposition pending results of labs. Discussed med's effects and SE's.   Over 30 minutes of exam, counseling, chart review, and critical decision making was performed.   Future Appointments  Date Time Provider Department Center  12/31/2023  4:00 PM Adela Glimpse, NP [facility] None  06/29/2024 10:00 AM Lucky Cowboy, MD [facility] None    ------------------------------------------------------------------------------------------------------------------   HPI BP (!) 150/94   Pulse 88   Temp (!) 97.5 F (36.4 C)   Ht 5' 5.5" (1.664 m)   Wt 139 lb (63 kg)   SpO2 95%   BMI 22.78 kg/m   72 y.o.female presents for Dry cough and clear drainage x 1 week  Denies nausea, vomiting, body aches, diarrhea and fever.  Dayquil is not helping.   Currently on Olmesartan 20 mg every day which was started 05/2023 . States it make her feel weak and have no energy. BP Readings from Last 3 Encounters:  09/09/23 (!) 150/94  06/16/23 (!) 181/92  02/06/23 128/88     Past Medical History:  Diagnosis Date   Stroke [facility])      Allergies  Allergen Reactions   Codeine Other (See Comments)    Nausea , Dizziness    Current Outpatient Medications on File Prior to Visit  Medication Sig   aspirin EC 81 MG EC tablet Take 1 tablet (81 mg total) by mouth daily.   atorvastatin (LIPITOR) 80 MG tablet Take 1 tablet Daily for Cholesterol   cholecalciferol (VITAMIN D3) 25 MCG (1000 UNIT) tablet Take 1,000 Units by mouth daily.   olmesartan (BENICAR) 20 MG tablet Take 1 tablet every night for BP   No current facility-administered medications on file prior to visit.    ROS: all negative except above.   Physical Exam:  BP (!) 150/94   Pulse 88   Temp (!) 97.5 F (36.4 C)   Ht 5' 5.5" (1.664 m)   Wt 139 lb (63 kg)   SpO2 95%   BMI 22.78 kg/m   General Appearance: Well nourished, in no apparent distress. Eyes: PERRLA, EOMs, conjunctiva no swelling or erythema Sinuses: No Frontal/maxillary tenderness ENT/Mouth: Ext aud canals clear, TMs without erythema,  bulging. No erythema, swelling, or exudate on post pharynx.  Hearing normal.  Neck: Supple, thyroid normal.  Respiratory: Respiratory effort normal, BS crackles left lung throughout. Right lung clear to auscultation Cardio: RRR with no MRGs. Brisk peripheral pulses without edema.  Abdomen: Soft, + BS.  Non tender, no guarding, rebound, hernias, masses. Lymphatics: Non tender without lymphadenopathy.  Musculoskeletal: Full ROM, 5/5 strength, normal gait.  Skin: Warm, dry without rashes, lesions, ecchymosis.  Neuro: Cranial nerves intact. Normal muscle tone, no cerebellar symptoms. Sensation intact.  Psych: Awake and oriented X 3, normal affect, Insight and  Judgment appropriate.     Raynelle Dick, NP 4:35 PM [facility]

## 2023-09-09 NOTE — Patient Instructions (Signed)

## 2023-09-24 ENCOUNTER — Telehealth: Payer: Self-pay | Admitting: Nurse Practitioner

## 2023-09-24 NOTE — Telephone Encounter (Signed)
Left message on patient machine to call back and let us know if she did increase her olmesartan to 40mg  and taking and recording her blood pressures daily.

## 2023-09-24 NOTE — Telephone Encounter (Signed)
I increased patients olmesartan to 40 mg daily.  Please call to see if she did increase the medication and is she checking her blood pressure?

## 2023-09-24 NOTE — Telephone Encounter (Signed)
Pt cancelled her appt on 10/22 for a 4 week f/u for blood pressure. I offered her to reschedule and said that you will most likely want to see her, but she declined the offer. Is this ok?

## 2023-09-29 NOTE — Telephone Encounter (Signed)
Left another message to make sure patient increased her olmesartan to 40 mg daily and is checking her blood pressures at home.

## 2023-10-14 ENCOUNTER — Ambulatory Visit: Payer: PPO | Admitting: Nurse Practitioner

## 2023-10-23 ENCOUNTER — Other Ambulatory Visit: Payer: Self-pay | Admitting: Internal Medicine

## 2023-10-23 DIAGNOSIS — E782 Mixed hyperlipidemia: Secondary | ICD-10-CM

## 2023-12-31 ENCOUNTER — Ambulatory Visit (INDEPENDENT_AMBULATORY_CARE_PROVIDER_SITE_OTHER): Payer: PPO | Admitting: Nurse Practitioner

## 2023-12-31 VITALS — BP 136/84 | HR 61 | Temp 97.9°F | Ht 65.5 in | Wt 142.2 lb

## 2023-12-31 DIAGNOSIS — E559 Vitamin D deficiency, unspecified: Secondary | ICD-10-CM | POA: Diagnosis not present

## 2023-12-31 DIAGNOSIS — Z8673 Personal history of transient ischemic attack (TIA), and cerebral infarction without residual deficits: Secondary | ICD-10-CM

## 2023-12-31 DIAGNOSIS — R0989 Other specified symptoms and signs involving the circulatory and respiratory systems: Secondary | ICD-10-CM | POA: Diagnosis not present

## 2023-12-31 DIAGNOSIS — Z79899 Other long term (current) drug therapy: Secondary | ICD-10-CM

## 2023-12-31 DIAGNOSIS — E782 Mixed hyperlipidemia: Secondary | ICD-10-CM

## 2023-12-31 DIAGNOSIS — Z6823 Body mass index (BMI) 23.0-23.9, adult: Secondary | ICD-10-CM | POA: Diagnosis not present

## 2023-12-31 NOTE — Progress Notes (Signed)
 FOLLOW UP  Assessment:    Melinda Burns was seen today for follow-up and medicare wellness.  History of CVA (cerebrovascular accident) without residual deficits Continue ASA, statin Control blood pressure, cholesterol, glucose, increase exercise.   Essential hypertension Slightly above goal in clinic however managed in home. Discussed reducing to 10 mg HS Continue to monitor medications at this time; Monitor blood pressure at home; call if consistently over 130/80 Continue DASH diet.   Reminder to go to the ER if any CP, SOB, nausea, dizziness, severe HA, changes vision/speech, left arm numbness and tingling and jaw pain.  Hyperlipidemia, mixed Continue statin.  Continue medications: atorvastatin , asa Discussed lifestyle modifications. Recommended diet heavy in fruits and veggies, omega 3's. Decrease consumption of animal meats, cheeses, and dairy products. Remain active and exercise as tolerated. Continue to monitor. Check lipids   Vitamin D  deficiency Continue supplement for goal of 60-100 Monitor levels   BMI 23.0-23.9, adult Discussed appropriate BMI Diet modification. Physical activity. Encouraged/praised to build confidence.   Medication management All medications discussed and reviewed in full. All questions and concerns regarding medications addressed.    Orders Placed This Encounter  Procedures   CBC with Differential/Platelet   COMPLETE METABOLIC PANEL WITH GFR   Lipid panel   Notify office for further evaluation and treatment, questions or concerns if any reported s/s fail to improve.   The patient was advised to call back or seek an in-person evaluation if any symptoms worsen or if the condition fails to improve as anticipated.   Further disposition pending results of labs. Discussed med's effects and SE's.    I discussed the assessment and treatment plan with the patient. The patient was provided an opportunity to ask questions and all were answered. The  patient agreed with the plan and demonstrated an understanding of the instructions.  Discussed med's effects and SE's. Screening labs and tests as requested with regular follow-up as recommended.  I provided 35 minutes of face-to-face time during this encounter including counseling, chart review, and critical decision making was preformed.  Future Appointments  Date Time Provider Department Center  06/29/2024 10:00 AM Tonita Fallow, MD [facility] None   Subjective:  Melinda Burns is a 73 y.o. female who presents for Medicare a 6 month follow up.   She is still working, works in psychologist, occupational. Retired freight forwarder.   Overall she reports feeling well.  She has no new concerns to report at this time.  Experienced a right basal ganglia infarct in 08/2018 secondary to small vessel disease following not presenting for medical screening or management for several decades.  She has recovered without sequela and has been released by neurology. Continues with aspirin  81 mg and atorvastatin .   BMI is Body mass index is 23.3 kg/m., she has been working on diet and exercise Wt Readings from Last 3 Encounters:  12/31/23 142 lb 3.2 oz (64.5 kg)  09/09/23 139 lb (63 kg)  06/16/23 140 lb 3.2 oz (63.6 kg)   Bps have typically been at goal; today their BP is BP: 136/84 She does workout. She denies chest pain, shortness of breath, dizziness.   She is on cholesterol medication (atorvastatin  80 mg every other day) and denies myalgias. Her cholesterol is at goal. The cholesterol last visit was:   Lab Results  Component Value Date   CHOL 136 06/16/2023   HDL 58 06/16/2023   LDLCALC 55 06/16/2023   TRIG 142 06/16/2023   CHOLHDL 2.3 06/16/2023    She has  been working on diet and exercise for glucose management. Last A1C in the office was:  Lab Results  Component Value Date   HGBA1C 5.5 06/16/2023   Last GFR: Lab Results  Component Value Date   GFRNONAA 69 04/12/2021   Patient is on  Vitamin D  supplement.   Lab Results  Component Value Date   VD25OH 55 06/16/2023      Medication Review: Current Outpatient Medications on File Prior to Visit  Medication Sig Dispense Refill   aspirin  EC 81 MG EC tablet Take 1 tablet (81 mg total) by mouth daily. 30 tablet 1   atorvastatin  (LIPITOR) 80 MG tablet Take 1 tablet Daily for Cholesterol 90 tablet 3   cholecalciferol (VITAMIN D3) 25 MCG (1000 UNIT) tablet Take 1,000 Units by mouth daily.     olmesartan  (BENICAR ) 20 MG tablet Take 1 tablet every night for BP 90 tablet 3   azithromycin  (ZITHROMAX ) 250 MG tablet Take 2 tablets (500 mg) on  Day 1,  followed by 1 tablet (250 mg) once daily on Days 2 through 5. 6 each 1   promethazine -dextromethorphan (PROMETHAZINE -DM) 6.25-15 MG/5ML syrup Take 5 mLs by mouth 4 (four) times daily as needed for cough. 240 mL 1   No current facility-administered medications on file prior to visit.    Allergies  Allergen Reactions   Codeine Other (See Comments)    Nausea , Dizziness    Current Problems (verified) Patient Active Problem List   Diagnosis Date Noted   Hyperlipidemia, mixed 10/13/2018   Vitamin D  deficiency 10/13/2018   Essential hypertension 09/15/2018   History of CVA (cerebrovascular accident) without residual deficits 08/28/2018    Screening Tests Immunization History  Administered Date(s) Administered   PFIZER(Purple Top)SARS-COV-2 Vaccination 01/28/2020, 02/18/2020, 09/19/2020    Patient Care Team: Tonita Fallow, MD as PCP - General (Internal Medicine)  SURGICAL HISTORY She  has a past surgical history that includes cataracts and cataract surgery. FAMILY HISTORY Her family history includes COPD in her mother; Heart attack (age of onset: 68) in her maternal aunt; Heart attack (age of onset: 43) in her maternal uncle; Heart attack (age of onset: 20) in her mother; Hyperlipidemia in her maternal grandfather; Stroke in her maternal grandfather; Transient ischemic  attack in her mother. SOCIAL HISTORY She  reports that she has never smoked. She has never used smokeless tobacco. She reports current alcohol use. She reports that she does not use drugs.  Review of Systems  Constitutional:  Negative for malaise/fatigue and weight loss.  HENT:  Negative for hearing loss and tinnitus.   Eyes:  Negative for blurred vision and double vision.  Respiratory:  Negative for cough, shortness of breath and wheezing.   Cardiovascular:  Negative for chest pain, palpitations, orthopnea, claudication and leg swelling.  Gastrointestinal:  Negative for abdominal pain, blood in stool, constipation, diarrhea, heartburn, melena, nausea and vomiting.  Genitourinary: Negative.   Musculoskeletal:  Negative for joint pain and myalgias.  Skin:  Negative for rash.  Neurological:  Negative for dizziness, tingling, sensory change, weakness and headaches.  Endo/Heme/Allergies:  Negative for polydipsia.  Psychiatric/Behavioral: Negative.    All other systems reviewed and are negative.    Objective:     Today's Vitals   12/31/23 1619  BP: 136/84  Pulse: 61  Temp: 97.9 F (36.6 C)  SpO2: 99%  Weight: 142 lb 3.2 oz (64.5 kg)  Height: 5' 5.5 (1.664 m)    General appearance: alert, no distress, WD/WN, female HEENT: normocephalic, sclerae anicteric,  TMs pearly, nares patent, no discharge or erythema, pharynx normal Oral cavity: MMM, no lesions Neck: supple, no lymphadenopathy, no thyromegaly, no masses Heart: RRR, normal S1, S2, no murmurs Lungs: CTA bilaterally, no wheezes, rhonchi, or rales Abdomen: +bs, soft, non tender, non distended, no masses, no hepatomegaly, no splenomegaly Musculoskeletal: nontender, no swelling, no obvious deformity Extremities: no edema, no cyanosis, no clubbing Pulses: 2+ symmetric, upper and lower extremities, normal cap refill Neurological: alert, oriented x 3, CN2-12 intact, strength normal upper extremities and lower extremities, sensation  normal throughout, DTRs 2+ throughout, no cerebellar signs, gait normal Psychiatric: normal affect, behavior normal, pleasant   [place], NP   12/31/2023

## 2024-01-01 LAB — COMPLETE METABOLIC PANEL WITH GFR
AG Ratio: 1.7 (calc) (ref 1.0–2.5)
ALT: 40 U/L — ABNORMAL HIGH (ref 6–29)
AST: 31 U/L (ref 10–35)
Albumin: 4.4 g/dL (ref 3.6–5.1)
Alkaline phosphatase (APISO): 81 U/L (ref 37–153)
BUN: 17 mg/dL (ref 7–25)
CO2: 32 mmol/L (ref 20–32)
Calcium: 10.2 mg/dL (ref 8.6–10.4)
Chloride: 101 mmol/L (ref 98–110)
Creat: 0.85 mg/dL (ref 0.60–1.00)
Globulin: 2.6 g/dL (ref 1.9–3.7)
Glucose, Bld: 91 mg/dL (ref 65–99)
Potassium: 5.1 mmol/L (ref 3.5–5.3)
Sodium: 140 mmol/L (ref 135–146)
Total Bilirubin: 0.4 mg/dL (ref 0.2–1.2)
Total Protein: 7 g/dL (ref 6.1–8.1)
eGFR: 72 mL/min/{1.73_m2} (ref >=60)

## 2024-01-01 LAB — CBC WITH DIFFERENTIAL/PLATELET
Absolute Lymphocytes: 2250 {cells}/uL (ref 850–3900)
Absolute Monocytes: 468 {cells}/uL (ref 200–950)
Basophils Absolute: 22 {cells}/uL (ref 0–200)
Basophils Relative: 0.4 %
Eosinophils Absolute: 110 {cells}/uL (ref 15–500)
Eosinophils Relative: 2 %
HCT: 40.7 % (ref 35.0–45.0)
Hemoglobin: 13.4 g/dL (ref 11.7–15.5)
MCH: 30.5 pg (ref 27.0–33.0)
MCHC: 32.9 g/dL (ref 32.0–36.0)
MCV: 92.7 fL (ref 80.0–100.0)
MPV: 9.5 fL (ref 7.5–12.5)
Monocytes Relative: 8.5 %
Neutro Abs: 2651 {cells}/uL (ref 1500–7800)
Neutrophils Relative %: 48.2 %
Platelets: 290 10*3/uL (ref 140–400)
RBC: 4.39 10*6/uL (ref 3.80–5.10)
RDW: 12 % (ref 11.0–15.0)
Total Lymphocyte: 40.9 %
WBC: 5.5 10*3/uL (ref 3.8–10.8)

## 2024-01-01 LAB — LIPID PANEL
Cholesterol: 155 mg/dL (ref <200)
HDL: 72 mg/dL (ref >=50)
LDL Cholesterol (Calc): 63 mg/dL
Non-HDL Cholesterol (Calc): 83 mg/dL (ref <130)
Total CHOL/HDL Ratio: 2.2 (calc) (ref <5.0)
Triglycerides: 122 mg/dL (ref <150)

## 2024-01-01 NOTE — Patient Instructions (Signed)

## 2024-05-06 NOTE — Progress Notes (Unsigned)
 New patient visit   Patient: Melinda Burns   DOB: July 29, 1951   73 y.o. Female  MRN: [id number] Visit Date: 05/07/2024  Today's healthcare provider: Trenton Frock, PA-C   No chief complaint on file.  Subjective    Melinda Burns is a 73 y.o. female who presents today as a new patient to establish care.   Htn -- olmesartan  20  History cva --- lipitor 80 ans asa 81  Past Medical History:  Diagnosis Date   Stroke [facility])    Past Surgical History:  Procedure Laterality Date   cataract surgery     cataracts     Family Status  Relation Name Status   Mother  Deceased   Father  Deceased   MGF  Deceased   Sister  Alive   Brother  Alive   MGM  Deceased   PGM  Deceased   PGF  Deceased   Mat Aunt  Deceased   Mat Uncle  Deceased  No partnership data on file   Family History  Problem Relation Age of Onset   Transient ischemic attack Mother    COPD Mother    Heart attack Mother 88       smoker, triple bypass   Stroke Maternal Grandfather    Hyperlipidemia Maternal Grandfather        triglycerides   Heart attack Maternal Aunt 45       smoker   Heart attack Maternal Uncle 96       smoker   Social History   Socioeconomic History   Marital status: Married    Spouse name: Not on file   Number of children: Not on file   Years of education: Not on file   Highest education level: Master's degree (e.g., MA, MS, MEng, MEd, MSW, MBA)  Occupational History   Occupation: accounting  Tobacco Use   Smoking status: Never   Smokeless tobacco: Never  Vaping Use   Vaping status: Never Used  Substance and Sexual Activity   Alcohol use: Yes    Comment: occasional wine   Drug use: Never   Sexual activity: Not on file  Other Topics Concern   Not on file  Social History Narrative   Not on file   Social Drivers of Health   Financial Resource Strain: Low Risk  (04/30/2024)   Overall Financial Resource Strain (CARDIA)    Difficulty of Paying Living Expenses: Not hard  at all  Food Insecurity: No Food Insecurity (04/30/2024)   Hunger Vital Sign    Worried About Running Out of Food in the Last Year: Never true    Ran Out of Food in the Last Year: Never true  Transportation Needs: No Transportation Needs (04/30/2024)   PRAPARE - Administrator, Civil Service (Medical): No    Lack of Transportation (Non-Medical): No  Physical Activity: Sufficiently Active (04/30/2024)   Exercise Vital Sign    Days of Exercise per Week: 7 days    Minutes of Exercise per Session: 30 min  Stress: Patient Declined (04/30/2024)   [employer] of Occupational Health - Occupational Stress Questionnaire    Feeling of Stress : Patient declined  Social Connections: Unknown (04/30/2024)   Social Connection and Isolation Panel [NHANES]    Frequency of Communication with Friends and Family: Patient declined    Frequency of Social Gatherings with Friends and Family: Patient declined    Attends Religious Services: Never    Database administrator or Organizations: No  Attends Banker Meetings: Not on file    Marital Status: Married   Outpatient Medications Prior to Visit  Medication Sig   aspirin  EC 81 MG EC tablet Take 1 tablet (81 mg total) by mouth daily.   atorvastatin  (LIPITOR) 80 MG tablet Take 1 tablet Daily for Cholesterol   cholecalciferol (VITAMIN D3) 25 MCG (1000 UNIT) tablet Take 1,000 Units by mouth daily.   olmesartan  (BENICAR ) 20 MG tablet Take 1 tablet every night for BP   promethazine -dextromethorphan (PROMETHAZINE -DM) 6.25-15 MG/5ML syrup Take 5 mLs by mouth 4 (four) times daily as needed for cough.   No facility-administered medications prior to visit.   Allergies  Allergen Reactions   Codeine Other (See Comments)    Nausea , Dizziness    Immunization History  Administered Date(s) Administered   PFIZER(Purple Top)SARS-COV-2 Vaccination 01/28/2020, 02/18/2020, 09/19/2020    Health Maintenance  Topic Date Due   DTaP/Tdap/Td (1 -  Tdap) Never done   Zoster Vaccines- Shingrix (1 of 2) Never done   Pneumonia Vaccine 41+ Years old (1 of 1 - PCV) Never done   COVID-19 Vaccine (4 - 2024-25 season) 08/24/2023   Medicare Annual Wellness (AWV)  02/07/2024   INFLUENZA VACCINE  07/23/2024   Hepatitis C Screening  Completed   HPV VACCINES  Aged Out   Meningococcal B Vaccine  Aged Out   MAMMOGRAM  Discontinued   DEXA SCAN  Discontinued   Colonoscopy  Discontinued    Patient Care Team: Vangie Genet, MD as PCP - General (Internal Medicine)  Review of Systems  {Insert previous labs (optional):23779} {See past labs  Heme  Chem  Endocrine  Serology  Results Review (optional):1}   Objective    There were no vitals taken for this visit. {Insert last BP/Wt (optional):23777}{See vitals history (optional):1}   Physical Exam ***  Depression Screen    06/16/2023    6:58 PM 02/06/2023    4:35 PM 04/12/2021    1:37 AM 07/26/2020    4:32 PM  PHQ 2/9 Scores  PHQ - 2 Score 0 0 0 0   No results found for any visits on 05/07/24.  Assessment & Plan     There are no diagnoses linked to this encounter.   No follow-ups on file.      Trenton Frock, PA-C  [facility] [phone] (phone) [phone] (fax)  [facility]

## 2024-05-07 ENCOUNTER — Ambulatory Visit (INDEPENDENT_AMBULATORY_CARE_PROVIDER_SITE_OTHER): Admitting: Physician Assistant

## 2024-05-07 ENCOUNTER — Encounter: Payer: Self-pay | Admitting: Physician Assistant

## 2024-05-07 VITALS — BP 126/86 | HR 70 | Ht 65.5 in | Wt 142.2 lb

## 2024-05-07 DIAGNOSIS — I10 Essential (primary) hypertension: Secondary | ICD-10-CM

## 2024-05-07 DIAGNOSIS — Z8673 Personal history of transient ischemic attack (TIA), and cerebral infarction without residual deficits: Secondary | ICD-10-CM | POA: Diagnosis not present

## 2024-05-07 MED ORDER — OLMESARTAN MEDOXOMIL 20 MG PO TABS
ORAL_TABLET | ORAL | 3 refills | Status: AC
Start: 2024-05-07 — End: ?

## 2024-05-07 NOTE — Assessment & Plan Note (Signed)
 Chronic, well controlled, manages with olmesartan  20 mg . Reviewed cmp F/u 6 mo

## 2024-05-07 NOTE — Assessment & Plan Note (Addendum)
 right basal ganglia infarct in 08/2018 secondary to small vessel disease  Denies any residual symptoms. Stays active. On lipitor 80 and asa 81 mg.

## 2024-06-01 ENCOUNTER — Telehealth: Payer: Self-pay | Admitting: Physician Assistant

## 2024-06-01 NOTE — Telephone Encounter (Signed)
 Copied from CRM [id number]. Topic: Medicare AWV >> Jun 01, 2024 11:05 AM Juliana Ocean wrote: Reason for CRM: Called 06/01/2024 to sched AWV - NO VOICEMAIL  Rosalee Collins; Care Guide Ambulatory Clinical Support [address] [facility] Direct Dial: [phone]

## 2024-06-29 ENCOUNTER — Encounter: Payer: PPO | Admitting: Internal Medicine

## 2024-11-05 ENCOUNTER — Ambulatory Visit (INDEPENDENT_AMBULATORY_CARE_PROVIDER_SITE_OTHER): Admitting: Student

## 2024-11-05 ENCOUNTER — Encounter: Payer: Self-pay | Admitting: Student

## 2024-11-05 VITALS — BP 146/86 | HR 77 | Temp 98.2°F | Resp 16 | Ht 65.0 in | Wt 143.6 lb

## 2024-11-05 DIAGNOSIS — E782 Mixed hyperlipidemia: Secondary | ICD-10-CM | POA: Diagnosis not present

## 2024-11-05 DIAGNOSIS — Z8673 Personal history of transient ischemic attack (TIA), and cerebral infarction without residual deficits: Secondary | ICD-10-CM | POA: Diagnosis not present

## 2024-11-05 DIAGNOSIS — E559 Vitamin D deficiency, unspecified: Secondary | ICD-10-CM

## 2024-11-05 DIAGNOSIS — I10 Essential (primary) hypertension: Secondary | ICD-10-CM

## 2024-11-05 DIAGNOSIS — R7309 Other abnormal glucose: Secondary | ICD-10-CM

## 2024-11-05 MED ORDER — ATORVASTATIN CALCIUM 80 MG PO TABS: ORAL_TABLET | ORAL | 3 refills | Status: AC

## 2024-11-05 NOTE — Assessment & Plan Note (Signed)
 Well controlled, no changes to meds. Encouraged heart healthy diet such as the DASH diet and exercise as tolerated.

## 2024-11-05 NOTE — Progress Notes (Signed)
 Subjective:     Patient ID: Melinda Burns, female    DOB: 1951-05-05, 73 y.o.   MRN: [id number]  Chief Complaint  Patient presents with   Transitions Of Care    Patient is here for a 6 month follow-up & TOC    HPI  Discussed the use of AI scribe software for clinical note transcription with the patient, who gave verbal consent to proceed.    Melinda Burns is a 73 year old female presents for TOC and follow-up chronic conditions.  She is Married for over 50 years. She has 3 adult children and 2 grandchildren. Unfortunately, one of her children is deceased; she has two living adult children. Currently working full-time, works in network engineer' business. Reports feeling well overall, no additional concerns today.  PMHx-Hx of CVA  Married, 2 son  HTN-olmesartan  20 mg daily BP at home:   HLD-80 mg Lipitor Taking this every other day  Vitamin D  deficiency-1000 units vitamin D  daily  Patient reports taking medications as prescribed, denies adverse side effects.  Patient denies fever, chills, SOB, CP, palpitations, dyspnea, edema, HA, vision changes, N/V/D, abdominal pain, urinary symptoms, rash, weight changes, and recent illness or hospitalizations.   History of Present Illness              Health Maintenance Due  Topic Date Due   Medicare Annual Wellness (AWV)  02/07/2024    Past Medical History:  Diagnosis Date   Stroke [facility])     Past Surgical History:  Procedure Laterality Date   cataract surgery     cataracts      Family History  Problem Relation Age of Onset   Transient ischemic attack Mother    COPD Mother    Heart attack Mother 73       smoker, triple bypass   Stroke Maternal Grandfather    Hyperlipidemia Maternal Grandfather        triglycerides   Heart attack Maternal Aunt 45       smoker   Heart attack Maternal Uncle 39       smoker    Social History   Socioeconomic History   Marital status: Married    Spouse name: Not on  file   Number of children: Not on file   Years of education: Not on file   Highest education level: Master's degree (e.g., MA, MS, MEng, MEd, MSW, MBA)  Occupational History   Occupation: accounting  Tobacco Use   Smoking status: Never   Smokeless tobacco: Never  Vaping Use   Vaping status: Never Used  Substance and Sexual Activity   Alcohol use: Yes    Comment: occasional wine   Drug use: Never   Sexual activity: Not on file  Other Topics Concern   Not on file  Social History Narrative   Not on file   Social Drivers of Health   Financial Resource Strain: Low Risk  (10/29/2024)   Overall Financial Resource Strain (CARDIA)    Difficulty of Paying Living Expenses: Not hard at all  Food Insecurity: No Food Insecurity (10/29/2024)   Hunger Vital Sign    Worried About Running Out of Food in the Last Year: Never true    Ran Out of Food in the Last Year: Never true  Transportation Needs: No Transportation Needs (10/29/2024)   PRAPARE - Administrator, Civil Service (Medical): No    Lack of Transportation (Non-Medical): No  Physical Activity: Sufficiently Active (10/29/2024)   Exercise Vital  Sign    Days of Exercise per Week: 5 days    Minutes of Exercise per Session: 30 min  Stress: No Stress Concern Present (10/29/2024)   [employer] of Occupational Health - Occupational Stress Questionnaire    Feeling of Stress: Only a little  Social Connections: Unknown (10/29/2024)   Social Connection and Isolation Panel    Frequency of Communication with Friends and Family: Patient declined    Frequency of Social Gatherings with Friends and Family: Patient declined    Attends Religious Services: Patient declined    Database Administrator or Organizations: Patient declined    Attends Engineer, Structural: Not on file    Marital Status: Married  Catering Manager Violence: Not on file    Outpatient Medications Prior to Visit  Medication Sig Dispense Refill    aspirin  EC 81 MG EC tablet Take 1 tablet (81 mg total) by mouth daily. 30 tablet 1   cholecalciferol (VITAMIN D3) 25 MCG (1000 UNIT) tablet Take 1,000 Units by mouth daily.     olmesartan  (BENICAR ) 20 MG tablet Take 1 tablet every night for BP 90 tablet 3   atorvastatin  (LIPITOR) 80 MG tablet Take 1 tablet Daily for Cholesterol 90 tablet 3   promethazine -dextromethorphan (PROMETHAZINE -DM) 6.25-15 MG/5ML syrup Take 5 mLs by mouth 4 (four) times daily as needed for cough. 240 mL 1   No facility-administered medications prior to visit.    Allergies  Allergen Reactions   Codeine Other (See Comments)    Nausea , Dizziness    ROS See  HPI    Objective:    Physical Exam Vitals reviewed.  Constitutional:      General: She is not in acute distress.    Appearance: She is not toxic-appearing.  HENT:     Head: Normocephalic and atraumatic.     Mouth/Throat:     Mouth: Mucous membranes are moist.     Pharynx: Oropharynx is clear.  Eyes:     Extraocular Movements: Extraocular movements intact.     Pupils: Pupils are equal, round, and reactive to light.  Cardiovascular:     Rate and Rhythm: Normal rate and regular rhythm.     Pulses: Normal pulses.     Heart sounds: Normal heart sounds. No murmur heard. Pulmonary:     Effort: Pulmonary effort is normal. No respiratory distress.     Breath sounds: Normal breath sounds. No wheezing.  Musculoskeletal:        General: No swelling.     Cervical back: Neck supple.  Skin:    General: Skin is warm and dry.  Neurological:     General: No focal deficit present.     Mental Status: She is alert and oriented to person, place, and time.  Psychiatric:        Mood and Affect: Mood normal.        Behavior: Behavior normal.        Thought Content: Thought content normal.        Judgment: Judgment normal.      BP (!) 146/86 (BP Location: Left Arm, Patient Position: Sitting, Cuff Size: Normal)   Pulse 77   Temp 98.2 F (36.8 C) (Oral)    Resp 16   Ht 5' 5 (1.651 m)   Wt 143 lb 9.6 oz (65.1 kg)   SpO2 99%   BMI 23.90 kg/m  Wt Readings from Last 3 Encounters:  11/05/24 143 lb 9.6 oz (65.1 kg)  05/07/24 142 lb 3.2  oz (64.5 kg)  12/31/23 142 lb 3.2 oz (64.5 kg)       Assessment & Plan:   Problem List Items Addressed This Visit     Essential hypertension - Primary   Well controlled, no changes to meds. Encouraged heart healthy diet such as the DASH diet and exercise as tolerated.        Relevant Medications   atorvastatin  (LIPITOR) 80 MG tablet   Other Relevant Orders   Comp Met (CMET)   TSH   History of CVA (cerebrovascular accident) without residual deficits   H/o right basal ganglia infarct in 08/2018, secondary to small vessel disease. Denies any residual symptoms. Stays active. On lipitor 80 and asa 81 mg.        Hyperlipidemia, mixed   Tolerating statin. encourage heart healthy diet such as MIND or DASH diet, increase exercise, avoid trans fats, simple carbohydrates and processed foods, consider a krill or fish or flaxseed oil cap daily.        Relevant Medications   atorvastatin  (LIPITOR) 80 MG tablet   Other Relevant Orders   CBC   Comp Met (CMET)   Lipid panel   Vitamin D  deficiency   Supplement and monitor      Relevant Orders   Vitamin D  (25 hydroxy)   Other Visit Diagnoses       Abnormal glucose       Relevant Orders   HgB A1c       I have discontinued Clella L. Force's promethazine -dextromethorphan. I am also having her maintain her aspirin  EC, cholecalciferol, olmesartan , and atorvastatin .  Meds ordered this encounter  Medications   atorvastatin  (LIPITOR) 80 MG tablet    Sig: Take 1 tablet Daily for Cholesterol    Dispense:  90 tablet    Refill:  3    Supervising Provider:   DOMENICA BLACKBIRD A [[id number]]

## 2024-11-05 NOTE — Assessment & Plan Note (Signed)
 Supplement and monitor

## 2024-11-05 NOTE — Assessment & Plan Note (Signed)
 Tolerating statin .encourage heart healthy diet such as MIND or DASH diet, increase exercise, avoid trans fats, simple carbohydrates and processed foods, consider a krill or fish or flaxseed oil cap daily.

## 2024-11-05 NOTE — Assessment & Plan Note (Signed)
 H/o right basal ganglia infarct in 08/2018, secondary to small vessel disease. Denies any residual symptoms. Stays active. On lipitor 80 and asa 81 mg.

## 2024-11-06 LAB — COMPREHENSIVE METABOLIC PANEL WITH GFR
AG Ratio: 1.7 (calc) (ref 1.0–2.5)
ALT: 26 U/L (ref 6–29)
AST: 30 U/L (ref 10–35)
Albumin: 4.2 g/dL (ref 3.6–5.1)
Alkaline phosphatase (APISO): 78 U/L (ref 37–153)
BUN: 16 mg/dL (ref 7–25)
CO2: 27 mmol/L (ref 20–32)
Calcium: 9.8 mg/dL (ref 8.6–10.4)
Chloride: 98 mmol/L (ref 98–110)
Creat: 0.8 mg/dL (ref 0.60–1.00)
Globulin: 2.5 g/dL (ref 1.9–3.7)
Glucose, Bld: 79 mg/dL (ref 65–99)
Potassium: 4.5 mmol/L (ref 3.5–5.3)
Sodium: 135 mmol/L (ref 135–146)
Total Bilirubin: 0.4 mg/dL (ref 0.2–1.2)
Total Protein: 6.7 g/dL (ref 6.1–8.1)
eGFR: 77 mL/min/1.73m2 (ref >=60)

## 2024-11-06 LAB — VITAMIN D 25 HYDROXY (VIT D DEFICIENCY, FRACTURES): Vit D, 25-Hydroxy: 52 ng/mL (ref 30–100)

## 2024-11-06 LAB — LIPID PANEL
Cholesterol: 133 mg/dL (ref <200)
HDL: 58 mg/dL (ref >=50)
LDL Cholesterol (Calc): 53 mg/dL
Non-HDL Cholesterol (Calc): 75 mg/dL (ref <130)
Total CHOL/HDL Ratio: 2.3 (calc) (ref <5.0)
Triglycerides: 141 mg/dL (ref <150)

## 2024-11-06 LAB — CBC
HCT: 38.6 % (ref 35.0–45.0)
Hemoglobin: 13 g/dL (ref 11.7–15.5)
MCH: 31.8 pg (ref 27.0–33.0)
MCHC: 33.7 g/dL (ref 32.0–36.0)
MCV: 94.4 fL (ref 80.0–100.0)
MPV: 10.6 fL (ref 7.5–12.5)
Platelets: 311 Thousand/uL (ref 140–400)
RBC: 4.09 Million/uL (ref 3.80–5.10)
RDW: 11.9 % (ref 11.0–15.0)
WBC: 6.2 Thousand/uL (ref 3.8–10.8)

## 2024-11-06 LAB — HEMOGLOBIN A1C
Hgb A1c MFr Bld: 5.4 % (ref <5.7)
Mean Plasma Glucose: 108 mg/dL
eAG (mmol/L): 6 mmol/L

## 2024-11-06 LAB — TSH: TSH: 1.82 m[IU]/L (ref 0.40–4.50)

## 2024-11-08 ENCOUNTER — Ambulatory Visit: Payer: Self-pay | Admitting: Student

## 2024-11-12 ENCOUNTER — Ambulatory Visit: Admitting: Family

## 2024-11-12 ENCOUNTER — Ambulatory Visit: Admitting: Physician Assistant

## 2025-05-06 ENCOUNTER — Ambulatory Visit: Admitting: Student
